# Patient Record
Sex: Female | Born: 1935 | Race: Black or African American | Hispanic: No | Smoking: Former smoker
Health system: Southern US, Community
[De-identification: ages and names within clinical notes are randomized; demographics above are authoritative.]

## PROBLEM LIST (undated history)

## (undated) DIAGNOSIS — I4821 Permanent atrial fibrillation: Secondary | ICD-10-CM

## (undated) DIAGNOSIS — K922 Gastrointestinal hemorrhage, unspecified: Secondary | ICD-10-CM

## (undated) DIAGNOSIS — R001 Bradycardia, unspecified: Secondary | ICD-10-CM

## (undated) DIAGNOSIS — R9430 Abnormal result of cardiovascular function study, unspecified: Secondary | ICD-10-CM

## (undated) DIAGNOSIS — K5790 Diverticulosis of intestine, part unspecified, without perforation or abscess without bleeding: Secondary | ICD-10-CM

## (undated) DIAGNOSIS — IMO0002 Reserved for concepts with insufficient information to code with codable children: Secondary | ICD-10-CM

## (undated) DIAGNOSIS — F0390 Unspecified dementia without behavioral disturbance: Secondary | ICD-10-CM

## (undated) DIAGNOSIS — T148XXA Other injury of unspecified body region, initial encounter: Secondary | ICD-10-CM

## (undated) DIAGNOSIS — I639 Cerebral infarction, unspecified: Secondary | ICD-10-CM

## (undated) DIAGNOSIS — K819 Cholecystitis, unspecified: Secondary | ICD-10-CM

## (undated) DIAGNOSIS — J90 Pleural effusion, not elsewhere classified: Secondary | ICD-10-CM

## (undated) DIAGNOSIS — T462X1A Poisoning by other antidysrhythmic drugs, accidental (unintentional), initial encounter: Secondary | ICD-10-CM

## (undated) DIAGNOSIS — Z9289 Personal history of other medical treatment: Secondary | ICD-10-CM

## (undated) DIAGNOSIS — D649 Anemia, unspecified: Secondary | ICD-10-CM

## (undated) DIAGNOSIS — I10 Essential (primary) hypertension: Secondary | ICD-10-CM

## (undated) DIAGNOSIS — I739 Peripheral vascular disease, unspecified: Secondary | ICD-10-CM

## (undated) HISTORY — DX: Bradycardia, unspecified: R00.1

## (undated) HISTORY — PX: OTHER SURGICAL HISTORY: SHX169

## (undated) HISTORY — DX: Abnormal result of cardiovascular function study, unspecified: R94.30

## (undated) HISTORY — DX: Peripheral vascular disease, unspecified: I73.9

## (undated) HISTORY — DX: Permanent atrial fibrillation: I48.21

## (undated) HISTORY — PX: PACEMAKER INSERTION: SHX728

## (undated) HISTORY — DX: Cholecystitis, unspecified: K81.9

## (undated) HISTORY — DX: Poisoning by other antidysrhythmic drugs, accidental (unintentional), initial encounter: T46.2X1A

## (undated) HISTORY — DX: Other injury of unspecified body region, initial encounter: T14.8XXA

## (undated) HISTORY — DX: Pleural effusion, not elsewhere classified: J90

## (undated) HISTORY — DX: Reserved for concepts with insufficient information to code with codable children: IMO0002

---

## 2010-07-08 ENCOUNTER — Encounter: Payer: Self-pay | Admitting: Internal Medicine

## 2010-09-02 ENCOUNTER — Inpatient Hospital Stay (HOSPITAL_COMMUNITY)
Admission: EM | Admit: 2010-09-02 | Discharge: 2010-09-07 | DRG: 378 | Disposition: A | Discharge status: Home / Self Care | Payer: Medicare Other | Source: Ambulatory Visit | Attending: Internal Medicine | Admitting: Internal Medicine

## 2010-09-02 ENCOUNTER — Encounter: Payer: Self-pay | Admitting: Internal Medicine

## 2010-09-02 DIAGNOSIS — I10 Essential (primary) hypertension: Secondary | ICD-10-CM | POA: Diagnosis present

## 2010-09-02 DIAGNOSIS — M4800 Spinal stenosis, site unspecified: Secondary | ICD-10-CM | POA: Diagnosis present

## 2010-09-02 DIAGNOSIS — D72829 Elevated white blood cell count, unspecified: Secondary | ICD-10-CM | POA: Diagnosis present

## 2010-09-02 DIAGNOSIS — Z8673 Personal history of transient ischemic attack (TIA), and cerebral infarction without residual deficits: Secondary | ICD-10-CM

## 2010-09-02 DIAGNOSIS — D649 Anemia, unspecified: Secondary | ICD-10-CM | POA: Diagnosis present

## 2010-09-02 DIAGNOSIS — F0390 Unspecified dementia without behavioral disturbance: Secondary | ICD-10-CM | POA: Diagnosis present

## 2010-09-02 DIAGNOSIS — D126 Benign neoplasm of colon, unspecified: Secondary | ICD-10-CM | POA: Diagnosis present

## 2010-09-02 DIAGNOSIS — Z23 Encounter for immunization: Secondary | ICD-10-CM

## 2010-09-02 DIAGNOSIS — K648 Other hemorrhoids: Secondary | ICD-10-CM | POA: Diagnosis present

## 2010-09-02 DIAGNOSIS — Z950 Presence of cardiac pacemaker: Secondary | ICD-10-CM

## 2010-09-02 DIAGNOSIS — K5731 Diverticulosis of large intestine without perforation or abscess with bleeding: Principal | ICD-10-CM | POA: Diagnosis present

## 2010-09-02 DIAGNOSIS — F05 Delirium due to known physiological condition: Secondary | ICD-10-CM | POA: Diagnosis not present

## 2010-09-02 DIAGNOSIS — Z79899 Other long term (current) drug therapy: Secondary | ICD-10-CM

## 2010-09-02 DIAGNOSIS — E876 Hypokalemia: Secondary | ICD-10-CM | POA: Diagnosis not present

## 2010-09-02 DIAGNOSIS — I4891 Unspecified atrial fibrillation: Secondary | ICD-10-CM | POA: Diagnosis present

## 2010-09-02 LAB — TYPE AND SCREEN
ABO/RH(D): O POS
Antibody Screen: NEGATIVE

## 2010-09-02 LAB — DIFFERENTIAL
Lymphs Abs: 1.5 10*3/uL (ref 0.7–4.0)
Monocytes Absolute: 0.8 10*3/uL (ref 0.1–1.0)
Monocytes Relative: 6 % (ref 3–12)
Neutro Abs: 10.1 10*3/uL — ABNORMAL HIGH (ref 1.7–7.7)
Neutrophils Relative %: 81 % — ABNORMAL HIGH (ref 43–77)

## 2010-09-02 LAB — CBC
HCT: 35.8 % — ABNORMAL LOW (ref 36.0–46.0)
HCT: 30.9 % — ABNORMAL LOW (ref 36.0–46.0)
Hemoglobin: 11.7 g/dL — ABNORMAL LOW (ref 12.0–15.0)
Hemoglobin: 10.1 g/dL — ABNORMAL LOW (ref 12.0–15.0)
MCH: 30.4 pg (ref 26.0–34.0)
MCH: 29.8 pg (ref 26.0–34.0)
MCHC: 32.7 g/dL (ref 30.0–36.0)
MCHC: 32.7 g/dL (ref 30.0–36.0)
MCV: 93 fL (ref 78.0–100.0)
MCV: 91.2 fL (ref 78.0–100.0)
Platelets: 115 10*3/uL — ABNORMAL LOW (ref 150–400)
Platelets: 108 10*3/uL — ABNORMAL LOW (ref 150–400)
RBC: 3.85 MIL/uL — ABNORMAL LOW (ref 3.87–5.11)
RBC: 3.39 MIL/uL — ABNORMAL LOW (ref 3.87–5.11)
RDW: 13.7 % (ref 11.5–15.5)
RDW: 13.8 % (ref 11.5–15.5)
WBC: 12.4 10*3/uL — ABNORMAL HIGH (ref 4.0–10.5)
WBC: 9.2 10*3/uL (ref 4.0–10.5)

## 2010-09-02 LAB — BASIC METABOLIC PANEL
BUN: 23 mg/dL (ref 6–23)
CO2: 27 mEq/L (ref 19–32)
Calcium: 9 mg/dL (ref 8.4–10.5)
Chloride: 104 mEq/L (ref 96–112)
Creatinine, Ser: 0.83 mg/dL (ref 0.50–1.10)
GFR calc Af Amer: >60 mL/min (ref >60)
GFR calc non Af Amer: >60 mL/min (ref >60)
Glucose, Bld: 123 mg/dL — ABNORMAL HIGH (ref 70–99)
Potassium: 4.2 mEq/L (ref 3.5–5.1)
Sodium: 139 mEq/L (ref 135–145)

## 2010-09-02 LAB — ABO/RH: ABO/RH(D): O POS

## 2010-09-02 LAB — OCCULT BLOOD, POC DEVICE: Fecal Occult Bld: POSITIVE

## 2010-09-02 NOTE — H&P (Signed)
Hospital Admission Note Date: 09/02/2010  Patient name: Brooke Griffin Medical record number: [id number] Date of birth: 1925-12-31 Age: 77 y.o. Gender: female PCP: No primary provider on file.  Medical Service: Internal Medicine  Attending physician:  Dr. Josem Kaufmann    Resident (R2/R3):  Dr. Baltazar Apo   Pager: [phone] Resident (R1):  Dr. Milbert Coulter   [phone]  Chief Complaint: Bright red blood per rectum  History of Present Illness:  Patient is an 77 yo female with a past medical history significant for diverticulosis, dementia, unknown cardiac condition requiring pacemaker who reports that she went to the bathroom this morning and noticed afterwards that her feces was very dark in color and that the toilet bowel was filled with bright red blood with clots. Patient's daughter then dressed patient with a panty-liner, which was quickly soiled with blood. Daughter claims the feces seemed to be mixed with blood, but admits she did not verify the blood was coming from the rectum and not the vagina. Patient claims that this is the first episode of this happening that she can recall, however, patient's daughter endorses of a history of diverticulosis requiring hospitalization approximately 20 years ago (which has not recurred since). Because of this bleeding, patient has not taken any medications today and has not eaten.  Patient admits to having dark colored stools for the past few days. Additionally, patient denies nausea but reports one episode of clear, watery emesis this morning.  Patient also states she has had intermittent lower abdominal cramping which shoots around to her back bilaterally for 2 weeks. She saw her PCP about this and he recommended Aleve BID, as he suspected this was caused by her spinal stenosis. Patient was give Aleve Rx 2 weeks ago, took Aleve BID for 1 week, last dose 08/29/2010. Patient denies increased fatigue or weakness, but does admit that she has been feeling slight more  lightheaded for a few days. This lightheadedness is not caused or exacerbated by standing. Patient also claims she has had bilateral shoulder pain for 1 week, and besides the abdominal cramping, denies any other pain. Patient denies having pain today. Patient has not signs of worsening or exacerbated dementia recently/changes to mental status, per daughter. Patient denies a history of hemorrhoids, constipation, diverticulitis, or any GI disease/problems. Denies recent diarrhea, change in consistency or frequency of stool, or pain with defecation. Also denies dysuria, hematuria, or increased frequency of urination.  Additionally, patient had "benign" polyps removed at last colonoscopy, which was "well over ten years ago," according to patient's daughter.    Allergies: NKDA  Medications: 1- Aleve (Naproxen Sodium) 220 mg 2 tablets once daily. (prescribed 2 wks ago, patient took this for 1 week, last dose 08/29/2010).  2- Atenolol 50 mg PO Qday 3- Lisinopril 40 mg PO Qday 4- Aggrenox (Aspirin/Dipyridamole XR) 25/200 PO 1 tablet daily 5- Digoxin 0.25 mg PO 1 tablet daily   PCP: [facility] in [place], [place].   Past Medical History: 1- Diverticulosis, requiring hospitalization approximately 20 yrs ago, asymptomatic since.  2- Pacemaker for an unknown cardiac condition which daughter believes is due to "her heart beating too fast," placed over ten years ago and is "due to be changed out," per daughter. Patient reports she is supposed to make an appointment with her cardiologist in [facility], [facility] Shark # [phone]. Patient on Digoxin and Aggrenox.  3- Spinal Stenosis. Duration unknown by daughter or patient.  4- Stroke, with residual right sided weakness. 2004.  5-TIA's, numerous.  6-  Hypertension, on home Atenolol and Lisinopril 7-Fibroids (now s/p hysterectomy) 8- Polyps, reportedly benign, removed >10 yrs ago 9- Dementia, type unknown, though daughter reports no MRI  changes when diagnosed a few yrs ago and believes the dementia is a result of her stroke.   Past Surgical History: 1- Hysterectomy when patient in her 64's for fibroids 2- Left Breast Cystectomy > 20 yrs ago, reportedly benign  Past Hospitalizations:  1-Stroke, 2004.   2-Broken Ankle, 40 yrs ago  3-Diverticulosis, 20 yrs ago.  Social History: Patient is from [place] town about [place], [place] and moved down to [place] 1 month ago to live with her daughter. Her daughter felt that since her mother has dementia, that she could care for her better than in her mother's previous state of independent living. Patient's daughter is her only child, and lives in [place]. Patient able to bathe, dress, and maintain appropriate hygiene by herself, though does not prepare her own finances or meals. Patient widowed in 2001. Patient drinks socially, liquor-3-4 drinks/week. Patient smokes 1.5 packs every 2 days, for over 56 years (>42 pack-years smoking history). Denies any recent or past recreational drug use or abuse. No history of ETOH abuse.   Family History: Negative family history of Colorectal cancer, Diverticulosis, Diverticulitis, DM, or HLD.  Mother: HTN Father: died of Leukemia, type unknown.   Review of Systems: Pertinent items are noted in HPI. Gen: Denies unintended changes to weight. Denies weakness, fever, chills, night sweats or fatigue.  Skin: Denies new lesions or rashes.  Head: Denies history of falls, no head trauma. Denies nausea or visual changes. +Emesis this am. Eyes: Denies blurry vision or acute visual loss.  Nose: +chronic allergic rhinitis. Denies nasal congestion.  Mouth: Denies bleeding gums or hoarseness.  Cardiac: Denies palpitations, PND, or angina. Respiratory: Denies wheezing, cough, sputum, hemoptysis.  GI: Denies changes to appetite, nausea, GERD or indigestion. Reports long-standing history of mild dysphagia. Denies abdominal pain. Further GI ROS as per HPI.   Urinary: Denies increased frequency, dysuria, hematuria, history of UTI's, or recent changes to mental status.  MSK: Denies muscle weakness other than residual right sided weakness from stroke.  Neuro: Denies loss of sensation/numbness, tingling, weakness, syncope, or seizures.  Psych: Denies history of anxiety or depression.   Physical Exam:  Vitals: Temp: 97.6, RR 20, HR 94, BP 113/64, O2 sat 98% on ra Repeat BP with orthostatics:  1- supine 135/63 with HR 84 2- sitting 127/71 with HR 75 3- standing 134/69 with HR 73  Gen: 77 yo [place] American female in no acute distress who appears her stated age, well-developed, well-dressed and in good hygiene who is sitting comfortably in bed. Pleasant but slightly confused affect. Skin: No visible lesions or rashes. Normal skin turgor. Head: NCAT.  Eyes: PERRLA. EOMI. Sclerae anicteric. Pink conjunctiva.  Ears: Bilaterally symmetric. No otorrhea or discharge. No gross inflammation. Normal gross auditory acuity.  Nose: No obvious inflammation, no rhinorrhea on exam. Throat: Good dental hygiene. Mildly dry mucous membranes No inflammation to oropharynx. Heart: Irregularly irregular rate, normal rhythm. No audible m/r/g. +2 radial pulses bilaterally, +1 posterior tibial pulses bilaterally.  Lungs: Chest symmetric with respirations. No increased work of breathing. CTAB. No wheezes, rales or rhonchi.  Abdomen: NABS. No palpable masses. No rebound or guarding. Midline abdominal scar from prior hysterectomy.  Rectal: Multiple skin tags around anus. No signs of internal or external hemorrhoids. Normal sphincter tone. No blood per rectal exam, though ED physician  reports +blood on rectal exam when patient first presented.  Neuro: CN 2-12 grossly intact.    Lab results:  CBC:    Component Value Date/Time   WBC 12.4* 09/02/2010 1230   HGB 11.7* 09/02/2010 1230   HCT 35.8* 09/02/2010 1230   PLT 115* 09/02/2010 1230   MCV 93.0 09/02/2010 1230    NEUTROABS 10.1* 09/02/2010 1230   LYMPHSABS 1.5 09/02/2010 1230   MONOABS 0.8 09/02/2010 1230   EOSABS 0.1 09/02/2010 1230   BASOSABS 0.0 09/02/2010 1230   % PMN's 81 (H-uln is 77), % Lymphs 12, % Monos 6, 0 Eosinophils, 0 Basophils, Abs lymphs 1.5, Abs monos 0.8, Abs Eos 0.1, Abs Basos 0, Abs PMN's 10.1 (H-uln is 7.7).  Comprehensive Metabolic Panel:    Component Value Date/Time   NA 139 09/02/2010 1230   K 4.2 09/02/2010 1230   CL 104 09/02/2010 1230   CO2 27 09/02/2010 1230   BUN 23 09/02/2010 1230   CREATININE 0.83 09/02/2010 1230   GLUCOSE 123* 09/02/2010 1230   CALCIUM 9.0 09/02/2010 1230    Assessment & Plan by Problem:  Assessment and Plan: Ms. Kay Shippy is an 77 yo F with a past medical history significant for diverticulosis, pacemaker for unknown cardiac history, and spinal stenosis who presents to the ED today due to bright red blood in her dark appearing stool this morning. Patient admitted to telemetry for likely overnight observation and monitoring.   1-GI Bleed: Patient reports history of diverticulosis, denies current pain, has no peritoneal signs on exam, was not actively bleeding on physical exam, and does not appear significantly dehydrated on exam. Vital signs not concerning for orthostatic hypotension. GI bleed likely lower in origin, considering bright red blood in stool with clots. Upper GI bleed possible as patient reports a few days of melena, though less likely given report of recent grossly bloody stool. Diverticulosis most likely cause of patient's painless rectal bleeding, given consistent recent and past medical history. Other potential causes of patient's likely lower GI bleed include cancer (patient reportedly long-overdue for colonoscopy), angiodysplasia (given patient's age). Ischemic bleed unlikely given no history of acute abdominal pain associated with onset of bleed.   Plan: -admit patient for overnight observation given significant cardiac history for  undetermined condition (patient on 2nd pacemaker with apparent need for pacemaker re-evaluation by cardiology), and history of stroke (undetermined by patient if ischemic or embolic in origin). Although gross bleed ceased by time of exam by admitting team, monitoring patient overnight  -monitor frequent vital signs -rehydrate with 0.9% NS at 100 cc/hr x12 hr given history of GI bleed, decreased PO intake today, emesis, and clinical signs of dehydration such as mildly dry mucous membranes  -am BMET and CBC. CBC at 9 pm tonight as well.  -Zofran 4 mg IV q4hr prn nausea -Tylenol 650 mg po q6hr prn pain/fever -will hold Aggrenox for tonight and resume in AM  2- Unknown Cardiac History requiring pacemaker: Patient reports requiring pacemaker for uncertain cause and says she is due to follow up with her cardiologist in [facility]. Patient with irregularly irregular HR on exam. -pending 12-lead ECG -will obtain more detailed information from pt's cardiologist Owens Shark [phone]) -observe on telemetry -restarted home Digoxin 0.25 mg po Qday, restarted Atenolol at half her daily dose giving 25 mg po Qday.  -holding home Lisinopril 40 mg qday and home Aggrenox 25/200 mg PO qday overnight, will consider restarting these meds tomorrow.  3-HTN: Long-standing history of HTN, likely cause  of patient's TIA's and past stroke. Pt with BP only mildly hypertensive (135/63 supine), thus will hold Lisinopril and give half dose of Atenolol until rehydrated overnight.  -BP meds as stated above.   4-Mild Leukocytosis with Left shift: WBC ct 12.4 with increased abs PMN's. Possibly stress reaction from GI bleed. We will have a low threshold to obtain U/A if patient begins to have any urinary symptoms or declining or fluctuating mental status.  -will follow wbc ct c evening and am CBC.   5-Dementia: Patient has long-standing history of dementia, thought to be related to stroke.  -Lights on during day. Will  frequently reorient patient. Encouraging frequent family visits.   6.-VTE Prophylaxis: SCDs

## 2010-09-03 DIAGNOSIS — K625 Hemorrhage of anus and rectum: Secondary | ICD-10-CM

## 2010-09-03 DIAGNOSIS — D62 Acute posthemorrhagic anemia: Secondary | ICD-10-CM

## 2010-09-03 DIAGNOSIS — K922 Gastrointestinal hemorrhage, unspecified: Secondary | ICD-10-CM

## 2010-09-03 LAB — BASIC METABOLIC PANEL
CO2: 24 mEq/L (ref 19–32)
Calcium: 8.4 mg/dL (ref 8.4–10.5)
Creatinine, Ser: 0.83 mg/dL (ref 0.50–1.10)
GFR calc non Af Amer: >60 mL/min (ref >60)
Glucose, Bld: 95 mg/dL (ref 70–99)
Sodium: 141 mEq/L (ref 135–145)

## 2010-09-03 LAB — CBC
HCT: 30.8 % — ABNORMAL LOW (ref 36.0–46.0)
MCH: 29.9 pg (ref 26.0–34.0)
MCH: 30.3 pg (ref 26.0–34.0)
MCHC: 32.6 g/dL (ref 30.0–36.0)
MCHC: 32.5 g/dL (ref 30.0–36.0)
MCHC: 32.4 g/dL (ref 30.0–36.0)
MCV: 91.8 fL (ref 78.0–100.0)
MCV: 92.8 fL (ref 78.0–100.0)
MCV: 93.6 fL (ref 78.0–100.0)
Platelets: 112 10*3/uL — ABNORMAL LOW (ref 150–400)
Platelets: 113 10*3/uL — ABNORMAL LOW (ref 150–400)
Platelets: 109 10*3/uL — ABNORMAL LOW (ref 150–400)
RBC: 3.31 MIL/uL — ABNORMAL LOW (ref 3.87–5.11)
RDW: 13.8 % (ref 11.5–15.5)
RDW: 13.9 % (ref 11.5–15.5)
WBC: 7.1 10*3/uL (ref 4.0–10.5)

## 2010-09-03 LAB — DIGOXIN LEVEL: Digoxin Level: 1.2 ng/mL (ref 0.8–2.0)

## 2010-09-04 DIAGNOSIS — K922 Gastrointestinal hemorrhage, unspecified: Secondary | ICD-10-CM

## 2010-09-04 DIAGNOSIS — R29898 Other symptoms and signs involving the musculoskeletal system: Secondary | ICD-10-CM

## 2010-09-04 LAB — CBC
Hemoglobin: 8.4 g/dL — ABNORMAL LOW (ref 12.0–15.0)
MCH: 30.8 pg (ref 26.0–34.0)
MCHC: 33.2 g/dL (ref 30.0–36.0)
Platelets: 112 10*3/uL — ABNORMAL LOW (ref 150–400)
Platelets: 125 10*3/uL — ABNORMAL LOW (ref 150–400)
RBC: 2.73 MIL/uL — ABNORMAL LOW (ref 3.87–5.11)
RDW: 13.9 % (ref 11.5–15.5)
WBC: 7.9 10*3/uL (ref 4.0–10.5)
WBC: 8 10*3/uL (ref 4.0–10.5)

## 2010-09-05 ENCOUNTER — Other Ambulatory Visit: Payer: Self-pay | Admitting: Internal Medicine

## 2010-09-05 DIAGNOSIS — K5730 Diverticulosis of large intestine without perforation or abscess without bleeding: Secondary | ICD-10-CM

## 2010-09-05 DIAGNOSIS — D126 Benign neoplasm of colon, unspecified: Secondary | ICD-10-CM

## 2010-09-05 DIAGNOSIS — K921 Melena: Secondary | ICD-10-CM

## 2010-09-05 LAB — BASIC METABOLIC PANEL
BUN: 17 mg/dL (ref 6–23)
CO2: 26 mEq/L (ref 19–32)
Chloride: 110 mEq/L (ref 96–112)
GFR calc Af Amer: >60 mL/min (ref >60)
Potassium: 3.2 mEq/L — ABNORMAL LOW (ref 3.5–5.1)

## 2010-09-05 LAB — CBC
HCT: 24.9 % — ABNORMAL LOW (ref 36.0–46.0)
HCT: 25.2 % — ABNORMAL LOW (ref 36.0–46.0)
Hemoglobin: 8.2 g/dL — ABNORMAL LOW (ref 12.0–15.0)
Hemoglobin: 8.4 g/dL — ABNORMAL LOW (ref 12.0–15.0)
MCHC: 33.3 g/dL (ref 30.0–36.0)
MCV: 92 fL (ref 78.0–100.0)
RBC: 2.72 MIL/uL — ABNORMAL LOW (ref 3.87–5.11)
RDW: 13.9 % (ref 11.5–15.5)
WBC: 8.9 10*3/uL (ref 4.0–10.5)

## 2010-09-06 ENCOUNTER — Inpatient Hospital Stay (HOSPITAL_COMMUNITY): Payer: Medicare Other

## 2010-09-06 DIAGNOSIS — K5731 Diverticulosis of large intestine without perforation or abscess with bleeding: Secondary | ICD-10-CM

## 2010-09-06 DIAGNOSIS — D126 Benign neoplasm of colon, unspecified: Secondary | ICD-10-CM

## 2010-09-06 LAB — CBC
HCT: 27.3 % — ABNORMAL LOW (ref 36.0–46.0)
Hemoglobin: 7.4 g/dL — ABNORMAL LOW (ref 12.0–15.0)
MCH: 30.5 pg (ref 26.0–34.0)
MCH: 30.5 pg (ref 26.0–34.0)
MCHC: 32.7 g/dL (ref 30.0–36.0)
MCHC: 34.1 g/dL (ref 30.0–36.0)
MCV: 93 fL (ref 78.0–100.0)
MCV: 89.5 fL (ref 78.0–100.0)
Platelets: 129 10*3/uL — ABNORMAL LOW (ref 150–400)
RBC: 2.43 MIL/uL — ABNORMAL LOW (ref 3.87–5.11)
RDW: 15.8 % — ABNORMAL HIGH (ref 11.5–15.5)

## 2010-09-06 LAB — BASIC METABOLIC PANEL
CO2: 25 mEq/L (ref 19–32)
Calcium: 8.1 mg/dL — ABNORMAL LOW (ref 8.4–10.5)
Creatinine, Ser: 0.8 mg/dL (ref 0.50–1.10)
Glucose, Bld: 121 mg/dL — ABNORMAL HIGH (ref 70–99)

## 2010-09-07 LAB — CROSSMATCH

## 2010-09-07 LAB — BASIC METABOLIC PANEL
Calcium: 8.3 mg/dL — ABNORMAL LOW (ref 8.4–10.5)
Creatinine, Ser: 0.85 mg/dL (ref 0.50–1.10)
GFR calc non Af Amer: >60 mL/min (ref >60)
Sodium: 143 mEq/L (ref 135–145)

## 2010-09-07 LAB — CBC
MCH: 30 pg (ref 26.0–34.0)
MCHC: 33.1 g/dL (ref 30.0–36.0)
Platelets: 130 10*3/uL — ABNORMAL LOW (ref 150–400)
RDW: 15.8 % — ABNORMAL HIGH (ref 11.5–15.5)

## 2010-09-08 ENCOUNTER — Telehealth: Payer: Self-pay

## 2010-09-08 NOTE — Telephone Encounter (Signed)
Message copied by Michele Mcalpine on Mon Sep 08, 2010  4:46 PM ------      Message from: Hilarie Fredrickson      Created: Mon Sep 08, 2010  4:32 PM       Please call patient and her daughter. But then noted that the polyp specimens were precancerous, but no cancer. She needs to have a recall colonoscopy in 3 MONTHS, in the [facility]. Please make recall

## 2010-09-08 NOTE — Telephone Encounter (Signed)
Pt and her daughter aware, recall entered.

## 2010-09-27 NOTE — Discharge Summary (Signed)
Brooke Griffin, Brooke Griffin              ACCOUNT NO.:  [id number]  MEDICAL RECORD NO.:  [id number]  LOCATION:  5020                         FACILITY:  [facility]  PHYSICIAN:  Doneen Poisson, MD     DATE OF BIRTH:  1925-05-02  DATE OF ADMISSION:  09/02/2010 DATE OF DISCHARGE:  09/07/2010                              DISCHARGE SUMMARY   DISCHARGE DIAGNOSES: 1. Acute GI bleed secondary to diverticulosis. 2. Multiple colonic polyps cannot rule out malignancy. 3. Internal hemorrhoids. 4. Hospital-acquired delirium. 5. Chronic atrial fibrillation with rapid ventricular response treated     with pacemaker. 6. Dementia. 7. Stroke. 8. Hypertension. 9. Spinal stenosis.  DISCHARGE MEDICATIONS: 1. Protonix 40 mg daily. 2. Digoxin 0.25 mg daily.  The following medications are to be stopped until she receives follow up  with her PCP: 1. Aggrenox. 2. Atenolol. 3. Lisinopril.  DISPOSITION AND FOLLOWUP:  [name] is to see her PCP in 1-2 weeks. The daughter agreed to call the PCP to make this appointment on the  morning of Monday, September 08, 2010.  The hemoglobin should be rechecked to verify that it is stable and it should be determined if she should be continued on Aggrenox for stroke and atrial fibrillation prophylaxis given her findings of severe diverticulosis.  PROCEDURES PERFORMED:  September 02, 2010, EKG, AFib/flutter and ventricular paced complexes with low  voltage throughout  Abdominal x-ray one view September 06, 2010.  There is a nonobstructive bowel  gas pattern and an approximately 1-cm clip projects over the distal  transverse colon in the left upper quadrant.  Colonoscopy remarkable for significant diverticulosis to left descending  colon.  Eight polyps with one to rule out malignancy and internal  hemorrhoids.  [employer] recalibrated pacemaker secondary to pacemaker failure on  September 04, 2010.  CONSULTATIONS:  Gastroenterology who performed above-stated colonoscopy [employer]  who recalibrated the patient's pacemaker.  BRIEF ADMITTING HISTORY AND PHYSICAL:  [name] is a 77 year old woman with a past medical history significant for diverticulosis, dementia, and chronic atrial fibrillation requiring pacemaker who reports that she went to the bathroom this morning and noticed afterwards that her feces was very dark in color and that the toilet bowl was filled with bright red blood with clots.  Her daughter then dressed her with  a panty liner which was then quickly soiled with blood.  The daughter  claims the feces seemed to be mixed with blood, but admits that she did  not verify if the blood was coming from the rectum and not the vagina.  The daughter called her mother's PCP who said that if she had any more bloody stools to go to the ED.  She subsequently had  another bloody stool.  She claims that this is the first episode of  this happening that she can recall.  However, she has baseline dementia  and her daughter endorses a history of diverticulosis requiring  hospitalization approximately 20 years ago, though has not recurred since.   Because of this bleeding, she has not taken any medications and has not eaten.  She admits to having dark colored stools for the past few days. Additionally, she denies nausea, but reports one episode of  clear watery emesis this morning.  She also states that she had intermittent lower abdominal cramping which shoots around to her back bilaterally for 2 weeks.  She saw her PCP about this and he recommended Aleve b.i.d. as he suspected spinal stenosis was causing her pain.  She was given Aleve for 2 weeks, but only took Aleve twice a day for 1 week.   Her last dose was August 29, 2010.  She denies increased fatigue or  weakness, but does admit that she began feeling slightly more lightheaded  for a few days.  This lightheadedness was not caused or exacerbated by  standing.  She also claims that she had bilateral shoulder pain  for 1  week and besides the abdominal cramping denies any other pain.  She  denies having pain today.  She has no signs of worsening or exacerbated  dementia recently or recent changes to mental status per daughter.  She  denies a history of hemorrhoids, constipation, diverticulitis or any GI  diseases or problems other than diverticulosis.  No recent diarrhea,  change in consistency or frequency of stool or pain with defecation.   She also denies dysuria, hematuria and increased frequency of urination. Additionally, she reports benign polyps removed at last colonoscopy  which was well over 10 years ago according to her daughter.  PHYSICAL EXAMINATION:  VITAL SIGNS:  Temperature 97.6, heart rate 94, blood pressure 113/64,  respiratory rate 20, oxygen saturation 98% on room air.  Orthostatics: Supine blood pressure 135/63, heart rate 84 Standing blood pressure 134/69, heart rate 73  GENERAL:  77 year old African American woman, in no acute distress who appears stated age, well-developed, who is sitting comfortably  in bed, pleasant, but slightly confused affect. SKIN:  No visible lesions or rashes.  Normal skin turgor. EYES:  PERRLA, EOMI, sclerae anicteric.  Pink conjunctivae. EARS:  Bilaterally symmetric.  No otorrhea or discharge.  No gross inflammation. NOSE:  No rhinorrhea on exam. THROAT:  Good dental hygiene, mildly dry mucous membranes.  No inflammation to oropharynx. HEART:  Irregularly irregular rate, normal rhythm.  No audible murmurs, rubs or gallops.  Radial pulses +2 bilaterally, +1 tibial pulses bilaterally. LUNGS:  Chest symmetric with respirations, no increased work of breathing.  CTAB.  No wheezes, rales or rhonchi. ABDOMEN:  NABS.  No palpable masses.  No rebound or guarding.  Midline abdominal scar from prior hysterectomy. RECTAL:  Multiple skin tags around anus.  No signs of internal or external hemorrhoids.  Normal sphincter tone.  No blood per rectal  exam, though ED physician reports positive blood in rectal exam when she first presented to ED. NEURO:  Cranial nerves II through XII grossly intact.  LABORATORY DATA ON ADMISSION:  White blood cell count 12.4, H and H 11.7 and 35.8, platelets 115.  Sodium 139, potassium 4.2, chloride 104,  CO2 27, BUN 23, creatinine 0.83, glucose 123, calcium 9.0.  HOSPITAL COURSE:  [name] is an 77 year old woman with a past medical history significant for diverticulosis, pacemaker for chronic AFib with RVR and spinal stenosis who was admitted to [facility] for a GI bleed.  1. GI bleed.  [name] reports a history of diverticulosis, denied     any associated abdominal pain and had no peritoneal signs on exam     and did not have positive orthostatics or appear severely dehydrated.       The etiology of the GI bleed was likely lower in origin given  history of bright red rectal bleed with clots, and external hemorrhoids     GI was consulted given that it had been over 10 years since her      previous colonoscopy and she endorses removal of a "benign" polyp.       GI performed a colonoscopy and found her to have severe diverticulosis,      8 polyps which were sent to pathology to rule out malignancy.  She was      transfused 2 units of packed red blood cells after colonoscopy.  2. Pacemaker failure.  [name] has a pacemaker for treatment of her     chronic atrial fibrillation with RVR and was last seen by her     cardiologist on Jul 13, 2010, for assessment of proper pacemaker function.      She was found to have multiple episodes in which her pacemaker failed to      capture beats. Her cardiologist was notified and the [employer]      technologist came to interrogate the pacemaker.    3. Hypertension. [name] Lisinopril and Atenolol were held in the    setting of an acute GI Bleed and was instructed to restart these medications    when seen by her PCP.    4. Hospital-acquired  delirium.  Mr. Brooke Griffin has a longstanding history     of baseline dementia, she demonstrated signs of delirium at night, such as      increased confusion, fluctuating mental status, pulling outlines and      altered sleep cycle.  Her daughter was notified and undertook measures      to assist with reorientation such as having family spend as much time as      possible with her, frequent reorienting and providing her with her glasses as     needed.  These measures seemed to improved these symptoms by the     time of discharge.  DISCHARGE LABORATORY DATA AND VITALS:  Temperature 97.8, heart rate 85, respiratory rate 16, blood pressure 115/52 and oxygen saturation 98% on room air.  Sodium 143, potassium 6.3, chloride 110, CO2 29, BUN 13, creatinine 0.85, eGFR greater than 60, calcium 8.3, white blood cell count 11.6, H and H 9.3 and 28.1, platelets 130.   ______________________________ Melida Quitter, MD   ______________________________ Doneen Poisson, MD   AS/MEDQ  D:  09/08/2010  T:  09/09/2010  Job:  [id number]  cc:   Dr. Ladona Ridgel  Electronically Signed by Melida Quitter  on 09/24/2010 04:28:29 PM Electronically Signed by Doneen Poisson  on 09/27/2010 12:16:51 PM

## 2010-11-15 ENCOUNTER — Emergency Department (HOSPITAL_COMMUNITY): Payer: Medicare Other

## 2010-11-15 ENCOUNTER — Inpatient Hospital Stay (HOSPITAL_COMMUNITY)
Admission: EM | Admit: 2010-11-15 | Discharge: 2010-12-04 | DRG: 418 | Disposition: A | Discharge status: Home Health | Payer: Medicare Other | Source: Ambulatory Visit | Attending: Internal Medicine | Admitting: Internal Medicine

## 2010-11-15 DIAGNOSIS — J90 Pleural effusion, not elsewhere classified: Secondary | ICD-10-CM | POA: Diagnosis not present

## 2010-11-15 DIAGNOSIS — E872 Acidosis, unspecified: Secondary | ICD-10-CM | POA: Diagnosis present

## 2010-11-15 DIAGNOSIS — Z8673 Personal history of transient ischemic attack (TIA), and cerebral infarction without residual deficits: Secondary | ICD-10-CM

## 2010-11-15 DIAGNOSIS — R109 Unspecified abdominal pain: Secondary | ICD-10-CM

## 2010-11-15 DIAGNOSIS — K8010 Calculus of gallbladder with chronic cholecystitis without obstruction: Secondary | ICD-10-CM

## 2010-11-15 DIAGNOSIS — I10 Essential (primary) hypertension: Secondary | ICD-10-CM | POA: Diagnosis present

## 2010-11-15 DIAGNOSIS — L03319 Cellulitis of trunk, unspecified: Secondary | ICD-10-CM | POA: Diagnosis not present

## 2010-11-15 DIAGNOSIS — I959 Hypotension, unspecified: Secondary | ICD-10-CM | POA: Diagnosis present

## 2010-11-15 DIAGNOSIS — D72829 Elevated white blood cell count, unspecified: Secondary | ICD-10-CM | POA: Diagnosis present

## 2010-11-15 DIAGNOSIS — F17200 Nicotine dependence, unspecified, uncomplicated: Secondary | ICD-10-CM | POA: Diagnosis present

## 2010-11-15 DIAGNOSIS — R911 Solitary pulmonary nodule: Secondary | ICD-10-CM | POA: Diagnosis present

## 2010-11-15 DIAGNOSIS — D62 Acute posthemorrhagic anemia: Secondary | ICD-10-CM | POA: Diagnosis not present

## 2010-11-15 DIAGNOSIS — L02219 Cutaneous abscess of trunk, unspecified: Secondary | ICD-10-CM | POA: Diagnosis not present

## 2010-11-15 DIAGNOSIS — E876 Hypokalemia: Secondary | ICD-10-CM | POA: Diagnosis not present

## 2010-11-15 DIAGNOSIS — Z950 Presence of cardiac pacemaker: Secondary | ICD-10-CM

## 2010-11-15 DIAGNOSIS — K810 Acute cholecystitis: Principal | ICD-10-CM | POA: Diagnosis present

## 2010-11-15 DIAGNOSIS — N390 Urinary tract infection, site not specified: Secondary | ICD-10-CM | POA: Diagnosis present

## 2010-11-15 DIAGNOSIS — I4891 Unspecified atrial fibrillation: Secondary | ICD-10-CM | POA: Diagnosis present

## 2010-11-15 HISTORY — DX: Essential (primary) hypertension: I10

## 2010-11-15 HISTORY — DX: Diverticulosis of intestine, part unspecified, without perforation or abscess without bleeding: K57.90

## 2010-11-15 HISTORY — DX: Unspecified dementia, unspecified severity, without behavioral disturbance, psychotic disturbance, mood disturbance, and anxiety: F03.90

## 2010-11-15 HISTORY — DX: Gastrointestinal hemorrhage, unspecified: K92.2

## 2010-11-15 HISTORY — DX: Cerebral infarction, unspecified: I63.9

## 2010-11-15 LAB — URINALYSIS, ROUTINE W REFLEX MICROSCOPIC
Glucose, UA: NEGATIVE mg/dL
Ketones, ur: 15 mg/dL — AB
Nitrite: POSITIVE — AB
Protein, ur: NEGATIVE mg/dL
Urobilinogen, UA: 2 mg/dL — ABNORMAL HIGH (ref 0.0–1.0)

## 2010-11-15 LAB — COMPREHENSIVE METABOLIC PANEL
Albumin: 3.3 g/dL — ABNORMAL LOW (ref 3.5–5.2)
Alkaline Phosphatase: 55 U/L (ref 39–117)
BUN: 26 mg/dL — ABNORMAL HIGH (ref 6–23)
Calcium: 9.3 mg/dL (ref 8.4–10.5)
GFR calc Af Amer: >60 mL/min (ref >60)
Glucose, Bld: 116 mg/dL — ABNORMAL HIGH (ref 70–99)
Potassium: 3.7 mEq/L (ref 3.5–5.1)
Sodium: 137 mEq/L (ref 135–145)
Total Protein: 7.1 g/dL (ref 6.0–8.3)

## 2010-11-15 LAB — CBC
Hemoglobin: 14.1 g/dL (ref 12.0–15.0)
MCHC: 32.9 g/dL (ref 30.0–36.0)
Platelets: 167 10*3/uL (ref 150–400)
RBC: 4.76 MIL/uL (ref 3.87–5.11)

## 2010-11-15 LAB — URINE MICROSCOPIC-ADD ON

## 2010-11-15 LAB — DIFFERENTIAL
Basophils Absolute: 0 10*3/uL (ref 0.0–0.1)
Basophils Relative: 0 % (ref 0–1)
Eosinophils Absolute: 0 10*3/uL (ref 0.0–0.7)
Neutro Abs: 15.2 10*3/uL — ABNORMAL HIGH (ref 1.7–7.7)
Neutrophils Relative %: 83 % — ABNORMAL HIGH (ref 43–77)

## 2010-11-15 LAB — GLUCOSE, CAPILLARY: Glucose-Capillary: 111 mg/dL — ABNORMAL HIGH (ref 70–99)

## 2010-11-15 LAB — POCT I-STAT TROPONIN I: Troponin i, poc: 0 ng/mL (ref 0.00–0.08)

## 2010-11-15 LAB — DIGOXIN LEVEL: Digoxin Level: 1.5 ng/mL (ref 0.8–2.0)

## 2010-11-16 LAB — DIFFERENTIAL
Basophils Relative: 0 % (ref 0–1)
Eosinophils Absolute: 0 10*3/uL (ref 0.0–0.7)
Eosinophils Relative: 0 % (ref 0–5)
Lymphs Abs: 1.4 10*3/uL (ref 0.7–4.0)
Monocytes Absolute: 1.9 10*3/uL — ABNORMAL HIGH (ref 0.1–1.0)
Monocytes Relative: 11 % (ref 3–12)
Neutrophils Relative %: 81 % — ABNORMAL HIGH (ref 43–77)

## 2010-11-16 LAB — COMPREHENSIVE METABOLIC PANEL
AST: 41 U/L — ABNORMAL HIGH (ref 0–37)
CO2: 24 mEq/L (ref 19–32)
Calcium: 8.6 mg/dL (ref 8.4–10.5)
Creatinine, Ser: 0.83 mg/dL (ref 0.50–1.10)
GFR calc Af Amer: >60 mL/min (ref >60)
GFR calc non Af Amer: >60 mL/min (ref >60)
Sodium: 141 mEq/L (ref 135–145)
Total Protein: 6.3 g/dL (ref 6.0–8.3)

## 2010-11-16 LAB — CBC
MCH: 28.9 pg (ref 26.0–34.0)
MCHC: 32.1 g/dL (ref 30.0–36.0)
MCV: 90.2 fL (ref 78.0–100.0)
Platelets: 168 10*3/uL (ref 150–400)
RBC: 4.39 MIL/uL (ref 3.87–5.11)

## 2010-11-17 ENCOUNTER — Inpatient Hospital Stay (HOSPITAL_COMMUNITY): Payer: Medicare Other

## 2010-11-17 LAB — CBC
HCT: 37 % (ref 36.0–46.0)
Hemoglobin: 11.9 g/dL — ABNORMAL LOW (ref 12.0–15.0)
MCH: 28.9 pg (ref 26.0–34.0)
RBC: 4.12 MIL/uL (ref 3.87–5.11)

## 2010-11-17 LAB — URINE CULTURE
Colony Count: NO GROWTH
Culture: NO GROWTH

## 2010-11-17 LAB — SURGICAL PCR SCREEN: Staphylococcus aureus: NEGATIVE

## 2010-11-17 MED ORDER — TECHNETIUM TC 99M TETROFOSMIN IV KIT
30.0000 | PACK | Freq: Once | INTRAVENOUS | Status: AC | PRN
  Administered 2010-11-17: 30 via INTRAVENOUS

## 2010-11-17 MED ORDER — TECHNETIUM TC 99M TETROFOSMIN IV KIT
10.0000 | PACK | Freq: Once | INTRAVENOUS | Status: AC | PRN
  Administered 2010-11-17: 10 via INTRAVENOUS

## 2010-11-18 LAB — CBC
MCV: 88.7 fL (ref 78.0–100.0)
Platelets: 198 10*3/uL (ref 150–400)
RBC: 3.98 MIL/uL (ref 3.87–5.11)
WBC: 14.6 10*3/uL — ABNORMAL HIGH (ref 4.0–10.5)

## 2010-11-18 LAB — BASIC METABOLIC PANEL
CO2: 28 mEq/L (ref 19–32)
Chloride: 103 mEq/L (ref 96–112)
Creatinine, Ser: 0.81 mg/dL (ref 0.50–1.10)
GFR calc Af Amer: >60 mL/min (ref >60)
Potassium: 2.9 mEq/L — ABNORMAL LOW (ref 3.5–5.1)
Sodium: 139 mEq/L (ref 135–145)

## 2010-11-19 ENCOUNTER — Inpatient Hospital Stay (HOSPITAL_COMMUNITY): Payer: Medicare Other

## 2010-11-19 ENCOUNTER — Encounter (HOSPITAL_COMMUNITY): Payer: Self-pay | Admitting: Radiology

## 2010-11-19 LAB — CBC
HCT: 35.2 % — ABNORMAL LOW (ref 36.0–46.0)
Hemoglobin: 11.7 g/dL — ABNORMAL LOW (ref 12.0–15.0)
RBC: 3.99 MIL/uL (ref 3.87–5.11)
WBC: 9.4 10*3/uL (ref 4.0–10.5)

## 2010-11-19 LAB — BASIC METABOLIC PANEL
BUN: 8 mg/dL (ref 6–23)
Chloride: 107 mEq/L (ref 96–112)
Glucose, Bld: 94 mg/dL (ref 70–99)
Potassium: 3.7 mEq/L (ref 3.5–5.1)
Sodium: 141 mEq/L (ref 135–145)

## 2010-11-19 LAB — OCCULT BLOOD X 1 CARD TO LAB, STOOL
Fecal Occult Bld: NEGATIVE
Fecal Occult Bld: NEGATIVE
Fecal Occult Bld: NEGATIVE

## 2010-11-19 MED ORDER — IOHEXOL 300 MG/ML  SOLN
80.0000 mL | Freq: Once | INTRAMUSCULAR | Status: AC | PRN
  Administered 2010-11-19: 80 mL via INTRAVENOUS

## 2010-11-20 LAB — BASIC METABOLIC PANEL
BUN: 6 mg/dL (ref 6–23)
GFR calc non Af Amer: >60 mL/min (ref >60)
Glucose, Bld: 99 mg/dL (ref 70–99)
Potassium: 3.1 mEq/L — ABNORMAL LOW (ref 3.5–5.1)

## 2010-11-21 ENCOUNTER — Other Ambulatory Visit (INDEPENDENT_AMBULATORY_CARE_PROVIDER_SITE_OTHER): Payer: Self-pay | Admitting: General Surgery

## 2010-11-21 ENCOUNTER — Inpatient Hospital Stay (HOSPITAL_COMMUNITY): Payer: Medicare Other

## 2010-11-21 DIAGNOSIS — I959 Hypotension, unspecified: Secondary | ICD-10-CM

## 2010-11-21 DIAGNOSIS — K810 Acute cholecystitis: Secondary | ICD-10-CM

## 2010-11-21 DIAGNOSIS — I4891 Unspecified atrial fibrillation: Secondary | ICD-10-CM

## 2010-11-21 HISTORY — PX: CHOLECYSTECTOMY: SHX55

## 2010-11-21 LAB — CARDIAC PANEL(CRET KIN+CKTOT+MB+TROPI)
CK, MB: 2.1 ng/mL (ref 0.3–4.0)
Relative Index: INVALID (ref 0.0–2.5)
Troponin I: <0.3 ng/mL (ref <0.30)

## 2010-11-21 LAB — COMPREHENSIVE METABOLIC PANEL
ALT: 30 U/L (ref 0–35)
BUN: 4 mg/dL — ABNORMAL LOW (ref 6–23)
Calcium: 8.7 mg/dL (ref 8.4–10.5)
GFR calc Af Amer: >60 mL/min (ref >60)
Glucose, Bld: 96 mg/dL (ref 70–99)
Sodium: 144 mEq/L (ref 135–145)
Total Protein: 5.7 g/dL — ABNORMAL LOW (ref 6.0–8.3)

## 2010-11-21 LAB — CBC
HCT: 28.8 % — ABNORMAL LOW (ref 36.0–46.0)
Hemoglobin: 11.5 g/dL — ABNORMAL LOW (ref 12.0–15.0)
Hemoglobin: 9.2 g/dL — ABNORMAL LOW (ref 12.0–15.0)
MCH: 29.1 pg (ref 26.0–34.0)
MCHC: 32.8 g/dL (ref 30.0–36.0)
RBC: 3.19 MIL/uL — ABNORMAL LOW (ref 3.87–5.11)
WBC: 28.8 10*3/uL — ABNORMAL HIGH (ref 4.0–10.5)

## 2010-11-22 LAB — CARDIAC PANEL(CRET KIN+CKTOT+MB+TROPI)
CK, MB: 2.6 ng/mL (ref 0.3–4.0)
Relative Index: INVALID (ref 0.0–2.5)
Relative Index: INVALID (ref 0.0–2.5)
Troponin I: <0.3 ng/mL (ref <0.30)
Troponin I: <0.3 ng/mL (ref <0.30)

## 2010-11-22 LAB — CBC
HCT: 19.2 % — ABNORMAL LOW (ref 36.0–46.0)
Hemoglobin: 6.4 g/dL — CL (ref 12.0–15.0)
Hemoglobin: 8.8 g/dL — ABNORMAL LOW (ref 12.0–15.0)
Platelets: 221 10*3/uL (ref 150–400)
RBC: 2.93 MIL/uL — ABNORMAL LOW (ref 3.87–5.11)
RDW: 13.3 % (ref 11.5–15.5)
WBC: 17.6 10*3/uL — ABNORMAL HIGH (ref 4.0–10.5)
WBC: 20.8 10*3/uL — ABNORMAL HIGH (ref 4.0–10.5)

## 2010-11-22 LAB — APTT: aPTT: 32 seconds (ref 24–37)

## 2010-11-22 LAB — BASIC METABOLIC PANEL
BUN: 11 mg/dL (ref 6–23)
Chloride: 111 mEq/L (ref 96–112)
GFR calc Af Amer: >60 mL/min (ref >60)
Potassium: 4.1 mEq/L (ref 3.5–5.1)
Sodium: 141 mEq/L (ref 135–145)

## 2010-11-22 LAB — CULTURE, BLOOD (ROUTINE X 2): Culture: NO GROWTH

## 2010-11-22 LAB — DIGOXIN LEVEL: Digoxin Level: 2.4 ng/mL — ABNORMAL HIGH (ref 0.8–2.0)

## 2010-11-22 LAB — PREPARE PLATELET PHERESIS: Unit division: 0

## 2010-11-22 LAB — PROTIME-INR: INR: 1.19 (ref 0.00–1.49)

## 2010-11-23 ENCOUNTER — Inpatient Hospital Stay (HOSPITAL_COMMUNITY): Payer: Medicare Other

## 2010-11-23 LAB — CROSSMATCH
ABO/RH(D): O POS
Antibody Screen: NEGATIVE
Unit division: 0

## 2010-11-23 LAB — COMPREHENSIVE METABOLIC PANEL
AST: 35 U/L (ref 0–37)
Albumin: 2.4 g/dL — ABNORMAL LOW (ref 3.5–5.2)
Calcium: 8.2 mg/dL — ABNORMAL LOW (ref 8.4–10.5)
Creatinine, Ser: 0.88 mg/dL (ref 0.50–1.10)
GFR calc non Af Amer: >60 mL/min (ref >60)
Total Protein: 5.1 g/dL — ABNORMAL LOW (ref 6.0–8.3)

## 2010-11-23 LAB — CBC
MCHC: 33.3 g/dL (ref 30.0–36.0)
Platelets: 228 10*3/uL (ref 150–400)
RDW: 13.7 % (ref 11.5–15.5)

## 2010-11-23 LAB — PROTIME-INR
INR: 1.32 (ref 0.00–1.49)
Prothrombin Time: 16.6 seconds — ABNORMAL HIGH (ref 11.6–15.2)

## 2010-11-24 LAB — CBC
Hemoglobin: 8 g/dL — ABNORMAL LOW (ref 12.0–15.0)
MCH: 29.9 pg (ref 26.0–34.0)
RBC: 2.68 MIL/uL — ABNORMAL LOW (ref 3.87–5.11)

## 2010-11-24 LAB — BASIC METABOLIC PANEL
CO2: 25 mEq/L (ref 19–32)
Calcium: 8.3 mg/dL — ABNORMAL LOW (ref 8.4–10.5)
Glucose, Bld: 117 mg/dL — ABNORMAL HIGH (ref 70–99)
Sodium: 141 mEq/L (ref 135–145)

## 2010-11-25 ENCOUNTER — Other Ambulatory Visit (HOSPITAL_COMMUNITY): Payer: Medicare Other

## 2010-11-25 LAB — CBC
HCT: 25.2 % — ABNORMAL LOW (ref 36.0–46.0)
Hemoglobin: 8.3 g/dL — ABNORMAL LOW (ref 12.0–15.0)
MCH: 30.4 pg (ref 26.0–34.0)
MCV: 92.3 fL (ref 78.0–100.0)
RBC: 2.73 MIL/uL — ABNORMAL LOW (ref 3.87–5.11)

## 2010-11-25 LAB — BASIC METABOLIC PANEL
BUN: 8 mg/dL (ref 6–23)
CO2: 25 mEq/L (ref 19–32)
Chloride: 112 mEq/L (ref 96–112)
Glucose, Bld: 105 mg/dL — ABNORMAL HIGH (ref 70–99)
Potassium: 3.9 mEq/L (ref 3.5–5.1)

## 2010-11-25 LAB — DIFFERENTIAL
Eosinophils Absolute: 0.1 10*3/uL (ref 0.0–0.7)
Lymphs Abs: 1.9 10*3/uL (ref 0.7–4.0)
Monocytes Relative: 9 % (ref 3–12)
Neutro Abs: 10.7 10*3/uL — ABNORMAL HIGH (ref 1.7–7.7)
Neutrophils Relative %: 77 % (ref 43–77)

## 2010-11-26 ENCOUNTER — Inpatient Hospital Stay (HOSPITAL_COMMUNITY): Payer: Medicare Other

## 2010-11-26 LAB — COMPREHENSIVE METABOLIC PANEL
ALT: 32 U/L (ref 0–35)
CO2: 25 mEq/L (ref 19–32)
Calcium: 8.6 mg/dL (ref 8.4–10.5)
Chloride: 112 mEq/L (ref 96–112)
Creatinine, Ser: 0.9 mg/dL (ref 0.50–1.10)
GFR calc Af Amer: 67 mL/min — ABNORMAL LOW (ref >90)
GFR calc non Af Amer: 58 mL/min — ABNORMAL LOW (ref >90)
Glucose, Bld: 104 mg/dL — ABNORMAL HIGH (ref 70–99)
Total Bilirubin: 1.3 mg/dL — ABNORMAL HIGH (ref 0.3–1.2)

## 2010-11-26 LAB — CBC
Hemoglobin: 8.6 g/dL — ABNORMAL LOW (ref 12.0–15.0)
MCH: 29.7 pg (ref 26.0–34.0)
MCV: 93.4 fL (ref 78.0–100.0)
RBC: 2.9 MIL/uL — ABNORMAL LOW (ref 3.87–5.11)

## 2010-11-26 LAB — BODY FLUID CELL COUNT WITH DIFFERENTIAL
Lymphs, Fluid: 3 %
Monocyte-Macrophage-Serous Fluid: 42 % — ABNORMAL LOW (ref 50–90)
Neutrophil Count, Fluid: 53 % — ABNORMAL HIGH (ref 0–25)

## 2010-11-26 LAB — GLUCOSE, SEROUS FLUID: Glucose, Fluid: 107 mg/dL

## 2010-11-26 NOTE — H&P (Signed)
Brooke Griffin, Brooke Griffin              ACCOUNT NO.:  [id number]  MEDICAL RECORD NO.:  [id number]  LOCATION:  [facility]                         FACILITY:  [facility]  PHYSICIAN:  Carlota Raspberry, MD         DATE OF BIRTH:  03-14-1925  DATE OF ADMISSION:  11/15/2010 DATE OF DISCHARGE:                             HISTORY & PHYSICAL   PRIMARY CARE PHYSICIAN:  Loyal Jacobson, MD  CHIEF COMPLAINT:  Malaise, poor p.o. intake, weakness and bilateral lower quadrant abdominal pain.  HISTORY OF PRESENT ILLNESS:  This 77 year old female who has a history of hypertension, AFib not on Coumadin, diverticulosis, CVA and dementia comes to the ED with pain in her bilateral lower quadrants that is migratory and radiating to her right flank, weakness and decreased p.o. intake since Thursday.  The patient herself is not much of a historian, but her daughter is able to relate this.  Starting Thursday, she started feeling unwell and also vomited some clear liquids.  She has been s[place].  She has been unable to get out of bed and has had poor p.o. intake and per the daughter, has poorly descried bilateral lower quadrant pain possibly radiating around into the right posterior lateral aspect of her stomach as well.  The daughter denies any frank fevers, chills or night sweats.  She was brought to the emergency room where she was found to be afebrile at 97.9 with blood pressure of 101/51 and a pulse of 87.  Through her course in the emergency room, she had an abdominal ultrasound concerning for cholecystitis and also appears to have urinary tract infection and a leukocytosis to 18.2.  She also appears to be hemoconcentrated at hematocrit of 42.9, which is above her baseline of 27-28.  Otherwise, she also had a chest x-ray with a incidental 1 cm left lower lobe nodule.  Per discussion with the ED, Surgery has evaluated the patient and will possibly operate, but they are requesting Medicine admission given the patient's  numerous comorbidities.  REVIEW OF SYSTEMS:  As above, otherwise, the patient's daughter states that there are no cardiopulmonary symptoms at present.  She has a history of diverticulosis, but no current GI bleed.  No fall, dizziness, lightheadedness and no dysuria.  She does have a nonproductive cough that is longstanding, otherwise, review of systems are unremarkable.  PAST MEDICAL HISTORY: 1. Hypertension. 2. GI bleed secondary to diverticulosis with recent admission in July     2012 also showing colonic polyps. 3. Internal hemorrhoids. 4. Hospital-acquired delirium. 5. Dementia. 6. Chronic atrial fibrillation on Aggrenox, but not Coumadin due to     history of falls and bruising. 7. History of stroke. 8. Spinal stenosis.  MEDICATION:  List reconciled by pharmacy includes; 1. Atenolol 50 mg daily. 2. Lisinopril 40 mg daily. 3. Digoxin 0.25 mg daily. 4. Aggrenox 25/200 daily.  ALLERGIES:  There are no known drug allergies.  SOCIAL HISTORY:  The patient lives with her daughter in [place], but she is currently still smoking half-pack per day.  She socially drinks alcohol, but has not had any for several weeks now.  She does not do any drugs.  She appears to have dementia, but is  still able to have minimal conversation.  PHYSICAL EXAMINATION:  VITAL SIGNS:  Blood pressure 93/71 ranging 87-116 on the ER monitor, pulse 75, 97%, 17. GENERAL:  She is an elderly female who does not appear in distress.  She is alert, following the conversation, able to answer simple questions, but not really able to fully explain why she is here.  She does not appear acutely toxic. HEENT:  Pupils are equal, round, reactive to light and accommodation. Her extraocular muscles are intact.  Her sclerae have some mudding, but overall clear.  Her mouth is not too dry appearing, fairly normal. NECK:  Supple and thin.  No cervical lymphadenopathy. LUNGS:  Clear to auscultation bilaterally with no  wheezes, crackles, rales or rhonchi. HEART:  Irregular, but it is not tachycardic.  There are no gross murmurs or gallops appreciated. ABDOMEN:  Soft, non-peritoneal, nondistended.  She does have some minimal facial grimacing to very deep palpation over her bladder, but she does have a negative Murphy sign and her right upper quadrant is fairly unremarkable. EXTREMITIES:  Warm and well perfused with no coolness, clubbing or cyanosis.  There is no bilateral lower extremity edema.  She is able to move her extremities spontaneously. NEURO:  Fairly nonfocal other than her attention and ability to have full conversation, which is fairly nonfocal.  LABORATORY DATA:  White blood cell count 18.2, hematocrit 42.9 with a baseline of 27-28, platelets 167.  Chemistry panel normal including renal 26 and 0.92 which is just a bit above baseline.  LFTs show an AST minimally up at 39, T bili is 1.6.  Her alk phos is 55, otherwise her LFTs are normal.  Albumin is 3.3.  Her calcium is 9.3, lactate is 1.1, lipase 18.  Troponin is negative, dig is 1.5.  UA with moderate bilirubinemia, 15 ketonuria, 2.0 urobilinogen, positive nitrites, small leukocyte esterase, 0-2 WBCs, rare bacteria and urine culture is pending.  RADIOGRAPHIC DATA:  Chest x-ray impression shows 1 cm nodule in the superior segmental left lower lobe suspicious for malignancy with CT chest with contrast recommended COPD/emphysema with no acute cardiopulmonary disease and mild cardiomegaly without pulmonary edema.  Abdominal ultrasound shows gallstones, gallbladder wall thickening and pericholecystic fluid concerning for cholecystitis and renal cysts.  EKG shows atrial fibrillation overall rate of 72 beats per minute. There is one long pause followed by V-paced beat, it is normal axis with a left bundle type pattern for the native beats.  There does not appear to be any acute ischemic changes and it is overall fairly unchanged compared  to prior.  IMPRESSION:  This 77 year old female with a history of atrial fibrillation not on Coumadin, dementia, stroke, hypertension, and diverticulosis presents with leukocytosis and borderline low blood pressures with potential infectious etiologies of urinary tract infection and cholecystitis. 1. Hypertension.  The daughter states her baseline blood pressures are     110.  She is currently 80-100, but mentating at baseline and making     urine output as of couple hours ago.  I suspect this is due to poor     p.o. intake given her ketonuria and hemoconcentrated hematocrit and     renal function just a bit above baseline.  She does not look     floridly septic to me despite her elevated white blood cell count.     Therefore at present, we will bolus her 2 L of IV fluids and     continue with IV antibiotics and watch her carefully. 2. Leukocytosis.  She appears to have cholecystitis and urinary tract     infection.  The pain in her lower quadrants and over her bladder is     explained by the urinary tract infection, but the cholecystitis by     imaging is not too severe at least as evidenced by her LFTs and     fairly normal alk phos.  We will cover with empiric Zosyn for broad     GNR and anaerobic coverage, which will likely cover her urine as     well.  Follow up with urine culture.  We will get one blood culture     for surveillance. 3. Cholecystitis.  Follow up Surgery recommendations regarding     operative management. 4. Lung nodule.  I informed the patient and daughter of this.  She is     a long-time smoker and still active, it is 1 cm in left lower lobe     and will need a CT chest with contrast urgently to fully evaluate. 5. Atrial fibrillation.  We will hold her home atenolol, lisinopril     and digoxin given her hypotension, but continue her Aggrenox.  The     daughter states she is not Coumadin for falls and bruising.  Her     EKG does not appear ischemic at present. 6.  Fluid, electrolytes and nutrition.  IV fluids as above.  We will     keep her n.p.o. for now, but if she requests we can try little bit     of clear liquids. 7. IV access.  She has small bore in her left arm and was just     replaced. 8. Prophylaxis.  Subcutaneous heparin, Colace, senna and Tylenol for     pain.  CODE STATUS:  She is full code for now as I discussed with her daughter.  The patient will be admitted to regular bed, [facility] Team 7.          ______________________________ Carlota Raspberry, MD     EB/MEDQ  D:  11/15/2010  T:  11/16/2010  Job:  [id number]  Electronically Signed by Carlota Raspberry MD on 11/26/2010 12:36:19 PM

## 2010-11-27 LAB — BASIC METABOLIC PANEL
BUN: 12 mg/dL (ref 6–23)
CO2: 24 mEq/L (ref 19–32)
Chloride: 111 mEq/L (ref 96–112)
Glucose, Bld: 123 mg/dL — ABNORMAL HIGH (ref 70–99)
Potassium: 3.7 mEq/L (ref 3.5–5.1)
Sodium: 142 mEq/L (ref 135–145)

## 2010-11-27 LAB — CBC
HCT: 28 % — ABNORMAL LOW (ref 36.0–46.0)
MCH: 29.5 pg (ref 26.0–34.0)
MCHC: 31.4 g/dL (ref 30.0–36.0)
MCV: 94 fL (ref 78.0–100.0)
Platelets: 393 10*3/uL (ref 150–400)
RDW: 16.1 % — ABNORMAL HIGH (ref 11.5–15.5)
WBC: 19.3 10*3/uL — ABNORMAL HIGH (ref 4.0–10.5)

## 2010-11-28 ENCOUNTER — Inpatient Hospital Stay (HOSPITAL_COMMUNITY): Payer: Medicare Other

## 2010-11-28 LAB — CBC
HCT: 26.6 % — ABNORMAL LOW (ref 36.0–46.0)
Hemoglobin: 8.5 g/dL — ABNORMAL LOW (ref 12.0–15.0)
MCH: 29.9 pg (ref 26.0–34.0)
MCV: 93.7 fL (ref 78.0–100.0)
Platelets: 390 10*3/uL (ref 150–400)
RBC: 2.84 MIL/uL — ABNORMAL LOW (ref 3.87–5.11)
WBC: 19.4 10*3/uL — ABNORMAL HIGH (ref 4.0–10.5)

## 2010-11-28 LAB — BASIC METABOLIC PANEL
BUN: 16 mg/dL (ref 6–23)
CO2: 25 mEq/L (ref 19–32)
Calcium: 8.5 mg/dL (ref 8.4–10.5)
Chloride: 111 mEq/L (ref 96–112)
Creatinine, Ser: 0.79 mg/dL (ref 0.50–1.10)
Glucose, Bld: 112 mg/dL — ABNORMAL HIGH (ref 70–99)

## 2010-11-28 MED ORDER — IOHEXOL 300 MG/ML  SOLN
100.0000 mL | Freq: Once | INTRAMUSCULAR | Status: AC | PRN
  Administered 2010-11-28: 100 mL via INTRAVENOUS

## 2010-11-29 LAB — COMPREHENSIVE METABOLIC PANEL
Albumin: 2.3 g/dL — ABNORMAL LOW (ref 3.5–5.2)
BUN: 16 mg/dL (ref 6–23)
CO2: 23 mEq/L (ref 19–32)
Calcium: 8.5 mg/dL (ref 8.4–10.5)
Chloride: 107 mEq/L (ref 96–112)
Creatinine, Ser: 0.73 mg/dL (ref 0.50–1.10)
GFR calc non Af Amer: 77 mL/min — ABNORMAL LOW (ref >90)
Total Bilirubin: 2 mg/dL — ABNORMAL HIGH (ref 0.3–1.2)

## 2010-11-29 LAB — CBC
HCT: 27.4 % — ABNORMAL LOW (ref 36.0–46.0)
MCH: 29.9 pg (ref 26.0–34.0)
MCV: 94.2 fL (ref 78.0–100.0)
RDW: 16.5 % — ABNORMAL HIGH (ref 11.5–15.5)
WBC: 18.4 10*3/uL — ABNORMAL HIGH (ref 4.0–10.5)

## 2010-11-29 NOTE — Consult Note (Signed)
Brooke Griffin, BRENN              ACCOUNT NO.:  [id number]  MEDICAL RECORD NO.:  [id number]  LOCATION:  3314                         FACILITY:  [facility]  PHYSICIAN:  Coralyn Helling, MD        DATE OF BIRTH:  05-02-1925  DATE OF CONSULTATION:  11/21/2010 DATE OF DISCHARGE:                                CONSULTATION   REFERRING PHYSICIAN:  Isidor Holts, MD  REASON FOR CONSULTATION:  Hypertension.  CHIEF COMPLAINT:  Abdominal pain.  HISTORY OF PRESENT ILLNESS:  Brooke Griffin is an 77 year old female who was admitted to the Hospitalist Service on November 15, 2010, with right upper quadrant pain.  She had an abdominal ultrasound, which showed findings concerning for acute cholecystitis.  She was seen by the Surgical Service and they recommended that she needed further cardiac evaluation prior to proceeding with surgery.  She was evaluated by Cardiology and had a nuclear stress test which showed a fixed inferior lateral wall defect compatible with a remote infarct but no areas of reversibility and ejection fraction of 66%.  She was then scheduled to have a laparoscopic cholecystectomy, which was done on the morning of November 21, 2010.  Her additional postoperative course was apparently uneventful.  However, on return to the surgical floor, she was noted to be somnolent and hypotensive.  She was given 1.5 liters of intravenous fluid and her blood pressure increased to 101/60 and her mental status also improved.  She was noted to be tachycardic with an irregular heart rate and heart rate in the 140s.  She is scheduled to be transferred to the step-down unit.  Critical Care consultation was requested to further assess her hypotension.  She currently denies dizziness.  She does feel like her heart is beating little bit fast.  She has mild chest discomfort but denies any significant abdominal pain.  PAST MEDICAL HISTORY:  Significant for: 1. Diverticulosis. 2. Lower GI bleeding. 3.  Colon polyposis 4. Atrial fibrillation. 5. Coronary artery disease. 6. Status post pacemaker insertion. 7. Dementia. 8. Previous CVA. 9. Hypertension. 10.Spinal stenosis. 11.Status post hysterectomy.  FAMILY HISTORY:  Significant for heart disease and hypertension.  SOCIAL HISTORY:  She is a smoker and has occasional alcohol use.  She lives with her daughter.  ALLERGIES:  No known drug allergies.  OUTPATIENT MEDICATIONS: 1. Aggrenox 1 tablet daily. 2. Atenolol 50 mg daily. 3. Digoxin 0.25 mg daily. 4. Lisinopril 40 mg daily.  CURRENT INPATIENT MEDICATIONS: 1. Metoprolol 25 mg b.i.d. 2. Digoxin 0.25 mg daily. 3. Potassium chloride 40 mEq daily. 4. Zosyn per Pharmacy.  Of note is that she did not receive her oral medications today in preparation for surgery.  IMAGING AND LABORATORIES:  CT scan of the chest from November 19, 2010, showed changes of acute calculous cholecystitis.  Sodium is 144, potassium is 3.3, chloride is 110, CO2 is 28, BUN is 4, creatinine 0.89. WBC is 28.9, hemoglobin is 9.2, hematocrit is 28.8, platelet count is 269.  IMPRESSION:  An 77 year old female who was found to have acute cholecystitis and she is now status post laparoscopic cholecystectomy. She developed postoperative hypotension which has responded to fluid. She is also in atrial fibrillation with rapid  ventricular response.  I would recommend continuing intravenous fluid with close monitoring of her oxygenation.  I will defer rate control of her atrial fibrillation to the Primary Service and Cardiology.  I would recommend continue her on antibiotics.  I would follow up on her hemoglobin closely and if she has continued decrease in her hemoglobin would recommend consideration for transfusion, particularly if we have more difficulty maintaining her blood pressure.  We will follow up on her lactic acid and cardiac enzymes, however, given the fact that she does have initial response to her  blood pressure with intravenous fluids, I think it would be reasonable to have her transferred to a step-down unit at the intensive care unit.  Critical Care Service will follow up when she is transferred to step-down unit.     Coralyn Helling, MD     VS/MEDQ  D:  11/21/2010  T:  11/22/2010  Job:  [id number]  Electronically Signed by Coralyn Helling MD on 11/29/2010 05:16:24 PM

## 2010-11-30 LAB — CBC
Hemoglobin: 8.8 g/dL — ABNORMAL LOW (ref 12.0–15.0)
Platelets: 431 10*3/uL — ABNORMAL HIGH (ref 150–400)
RBC: 2.88 MIL/uL — ABNORMAL LOW (ref 3.87–5.11)
WBC: 13.6 10*3/uL — ABNORMAL HIGH (ref 4.0–10.5)

## 2010-11-30 LAB — BASIC METABOLIC PANEL
BUN: 15 mg/dL (ref 6–23)
CO2: 22 mEq/L (ref 19–32)
Calcium: 8.6 mg/dL (ref 8.4–10.5)
Chloride: 108 mEq/L (ref 96–112)
Creatinine, Ser: 0.89 mg/dL (ref 0.50–1.10)
GFR calc Af Amer: 68 mL/min — ABNORMAL LOW (ref >90)
GFR calc non Af Amer: 59 mL/min — ABNORMAL LOW (ref >90)
Glucose, Bld: 106 mg/dL — ABNORMAL HIGH (ref 70–99)
Sodium: 141 mEq/L (ref 135–145)

## 2010-11-30 LAB — BODY FLUID CULTURE

## 2010-12-01 ENCOUNTER — Inpatient Hospital Stay (HOSPITAL_COMMUNITY): Payer: Medicare Other

## 2010-12-01 LAB — CBC
MCH: 29.4 pg (ref 26.0–34.0)
MCHC: 31.3 g/dL (ref 30.0–36.0)
Platelets: 426 10*3/uL — ABNORMAL HIGH (ref 150–400)
RDW: 16.7 % — ABNORMAL HIGH (ref 11.5–15.5)

## 2010-12-01 LAB — COMPREHENSIVE METABOLIC PANEL
ALT: 52 U/L — ABNORMAL HIGH (ref 0–35)
Albumin: 2.2 g/dL — ABNORMAL LOW (ref 3.5–5.2)
Alkaline Phosphatase: 55 U/L (ref 39–117)
Calcium: 8.6 mg/dL (ref 8.4–10.5)
GFR calc Af Amer: 67 mL/min — ABNORMAL LOW (ref >90)
Potassium: 3.7 mEq/L (ref 3.5–5.1)
Sodium: 142 mEq/L (ref 135–145)
Total Protein: 5.5 g/dL — ABNORMAL LOW (ref 6.0–8.3)

## 2010-12-03 LAB — COMPREHENSIVE METABOLIC PANEL
Albumin: 2.3 g/dL — ABNORMAL LOW (ref 3.5–5.2)
Alkaline Phosphatase: 64 U/L (ref 39–117)
BUN: 15 mg/dL (ref 6–23)
Chloride: 107 mEq/L (ref 96–112)
Creatinine, Ser: 1.19 mg/dL — ABNORMAL HIGH (ref 0.50–1.10)
GFR calc Af Amer: 48 mL/min — ABNORMAL LOW (ref >90)
Glucose, Bld: 94 mg/dL (ref 70–99)
Potassium: 4.5 mEq/L (ref 3.5–5.1)
Total Bilirubin: 1.1 mg/dL (ref 0.3–1.2)
Total Protein: 5.8 g/dL — ABNORMAL LOW (ref 6.0–8.3)

## 2010-12-03 LAB — CBC
HCT: 30.7 % — ABNORMAL LOW (ref 36.0–46.0)
Hemoglobin: 9.4 g/dL — ABNORMAL LOW (ref 12.0–15.0)
MCHC: 30.6 g/dL (ref 30.0–36.0)
MCV: 96.8 fL (ref 78.0–100.0)
RDW: 17 % — ABNORMAL HIGH (ref 11.5–15.5)

## 2010-12-03 LAB — VANCOMYCIN, TROUGH: Vancomycin Tr: 13.1 ug/mL (ref 10.0–20.0)

## 2010-12-03 NOTE — Progress Notes (Signed)
Brooke Griffin, Brooke Griffin              ACCOUNT NO.:  [id number]  MEDICAL RECORD NO.:  [id number]  LOCATION:  3314                         FACILITY:  [facility]  PHYSICIAN:  Erick Blinks, MD     DATE OF BIRTH:  05-11-1925                                PROGRESS NOTE   CURRENT PROBLEM LIST: 1. Acute cholecystitis status post laparoscopic cholecystectomy. 2. Acute blood loss anemia, postoperatively, stable. 3. Hypotension secondary to acute blood loss anemia, improved. 4. Leukocytosis, possibly demargination secondary to acute anemia,     with slow improvement. 5. Elevated lactic acid likely secondary to transient hypotension. 6. Atrial fibrillation with rapid ventricular response, improved, not     a candidate for Coumadin due to falls and recent anemia. 7. History of cerebrovascular accident. 8. History of hypertension. 9. Right-sided pleural effusion.  HISTORY OF PRESENT ILLNESS: This is an 77 year old female, who comes to the emergency room with complaints of bilateral lower quadrant abdominal pain as well as right upper quadrant pain.  The patient is accompanied by her daughter.  She reports that her discomfort started on Thursday.  She was feeling unwell and also vomited clear liquids.  She was brought to the emergency room where she had an abdominal ultrasound concerning for cholecystitis.  She also was found to have leukocytosis of 18,000 and was found to be somewhat dehydrated.  The patient was apparently admitted to the hospital for further evaluation.  For details, please refer to the history and physical dictated by Dr. Kaylyn Layer.  HOSPITAL COURSE: 1. Severe acute cholecystitis.  The patient was seen in consultation     by [facility] Surgery and after cardiac clearance, she     underwent laparoscopic cholecystectomy.  The patient tolerated this     procedure, but postop was noted to have a significant anemia.  She     was then followed by the Surgical Service.  Her diet  has continued     to be advanced to a regular diet. 2. Acute blood loss anemia.  The patient had a drop in her hemoglobin     from 11.7, postoperatively down to 6.4.  She was transfused a total     of 2 units PRBCs and her hemoglobin responded appropriately to 8.8.     Currently, it is ranging between 8 and 8.8.  Her bleeding seems to     have controlled at this point, but will need further followup.  Her     Aggrenox which she was taking post CVA has currently been held. 3. Atrial fibrillation with rapid ventricular response.  This was     likely secondary in response to her acute anemia.  She was seen in     consultation by Dr. Sharyn Lull and placed on digoxin as well as     amiodarone.  She is currently rate controlled, again not a     candidate for Coumadin.  Of note, the patient did have a stress     test done during this hospitalization for surgical clearance.  It     did show a fixed inferolateral wall defect compatible with the     remote infarct and ejection fraction was 66%.  No reversible     defects. 4. Hypotension.  The patient did have an episode of hypotension, which     did not initially respond to IV fluids.  This was postoperatively     and she was transferred to the step-down unit for closer     monitoring.  She was noted also to be tachycardic.  Lactic acid was     found to be elevated.  Her white count was also found to be     elevated as well.  There does not appear clear source of infection     at this time.  She has been continued on Zosyn, which was started     on admission.  Blood culture has been nonrevealing.  She was     continued on Zosyn, but her hypotension was likely secondary to her     acute blood loss which has subsequently improved with transfusion     of blood products. 5. Right pleural effusion with aggressive IV hydration for     hypotension.  She has developed a moderate right-sided pleural     effusion.  She does have a bit of a cough at this time.   The     patient is maintaining her oxygen saturations on room air, but does     have some increased work of breathing since her effusion is     unilateral.  I think she may benefit from a thoracentesis to drain     the fluid and provide symptomatic relief and this should be done. 6. Leukocytosis.  Again may be secondary to demargination from her     hypotension and acute blood loss anemia, although this has been     slow to improve.  We will recent cultures at this point and follow     closely.  She is currently on Zosyn.  She has not had a significant     fever, therefore, we will not escalate antibiotics any further. 7. Disposition.  Plan will eventually be to discharge the patient to     home with home health services.  At this time, it is felt that she     is stable enough to transition back to a telemetry unit to continue     her medical care.  We anticipate that she should be ready for     discharge in the next 48-72 hours.  CONSULTATIONS: 1. Cardiology, Dr. Eduardo Osier. Harwani. 2. [facility].  PROCEDURES: 1. Laparoscopic cholecystectomy with intraoperative cholangiogram. 2. Diagnostic imaging.  DIAGNOSTICS/IMAGING: 1. Chest x-ray on admission shows a proximal 1 cm nodule, which I     believe is in the superior segment of left lower lobe.  This is     suspicious for malignancy. 2. Ultrasound of abdomen on November 15, 2010, shows examination is     positive for gallstones, gallbladder wall thickening, and     pericholecystic fluid finding concerning for cholecystitis. 3. Stress test on November 17, 2010, shows fixed inferolateral wall     defect compatible with a remote infarct.  Normal contractility and     wall motion through the remaining segments.  No reversibility.     Normal function with an estimated ejection fraction of 66%. 4. CT of chest with contrast on November 19, 2010, shows no worrisome     pulmonary nodules or mass.  The abnormality on chest  x-ray with a     benign-appearing bone island in the posterior rib, advanced  atherosclerotic changes involving the aorta and branch vessels,     acute calculous cholecystitis. 5. Intraoperative cholangiogram shows a negative intraoperative     cholangiogram. 6. Chest x-ray on November 23, 2010, shows new moderate right pleural     effusion, basilar atelectasis on the left.     Erick Blinks, MD     JM/MEDQ  D:  11/24/2010  T:  11/24/2010  Job:  [id number]  Electronically Signed by Durward Mallard Lorae Roig  on 12/03/2010 12:20:06 AM

## 2010-12-04 LAB — CBC
MCHC: 30.8 g/dL (ref 30.0–36.0)
Platelets: 483 10*3/uL — ABNORMAL HIGH (ref 150–400)
RDW: 16.8 % — ABNORMAL HIGH (ref 11.5–15.5)
WBC: 11.9 10*3/uL — ABNORMAL HIGH (ref 4.0–10.5)

## 2010-12-04 LAB — BASIC METABOLIC PANEL
GFR calc Af Amer: 61 mL/min — ABNORMAL LOW (ref >90)
GFR calc non Af Amer: 53 mL/min — ABNORMAL LOW (ref >90)
Potassium: 4.1 mEq/L (ref 3.5–5.1)
Sodium: 141 mEq/L (ref 135–145)

## 2010-12-09 NOTE — Consult Note (Signed)
  [name], Griffin              ACCOUNT NO.:  [id number]  MEDICAL RECORD NO.:  [id number]  LOCATION:  3036                         FACILITY:  [facility]  PHYSICIAN:  Almond Lint, MD       DATE OF BIRTH:  Jan 07, 1926  DATE OF CONSULTATION:  11/15/2010 DATE OF DISCHARGE:                                CONSULTATION   CHIEF COMPLAINT:  Abdominal pain.  HISTORY OF PRESENT ILLNESS:  [name] is a 77 year old female who has had around 2-3 days of epigastric and right upper quadrant pain.  She has not been able to eat fairly anything.  She has had nausea throughout this time and has not vomited.  She comes with her daughter because she is quite demented.  Daughter is able to assess part of the history and review of systems.  She has not been able to eat much for the last few days and has been quite weak.  She describes the pain is radiating to her right flank.  She has baseline weakness from her prior stroke that was exacerbated.  PAST MEDICAL HISTORY:  Hypertension, GI bleed, hemorrhoids, delirium, atrial fibrillation, CVA, and spinal stenosis.  PAST SURGICAL HISTORY:  TAH and a pacemaker.  FAMILY HISTORY:  Coronary artery disease and hypertension.  SOCIAL HISTORY:  She uses tobacco and alcohol occasionally.  She lives with her daughter in [place].  REVIEW OF SYSTEMS:  Positive for cough, otherwise negative other than the things included in the HPI and past medical history.  MEDICATION:  Digoxin, Aggrenox, lisinopril, and atenolol.  ALLERGIES:  None.  PHYSICAL EXAMINATION:  VITAL SIGNS:  Temperature 97.9, heart rate 87, blood pressure 100/51, respiratory rate 20, oxygen sats 98% on room air. GENERAL:  She is alert and oriented x3, in no acute distress. HEENT:  Normocephalic, atraumatic.  Sclerae are anicteric. NECK:  Supple.  No lymphadenopathy.  No thyromegaly.  Trachea is midline. HEART:  Irregularly irregular, audible. LUNGS:  Clear to auscultation bilaterally with some  faint expiratory wheezing. ABDOMEN:  Her right quadrant is slightly tender.  It appears to be slightly distended.  No signs of trauma. EXTREMITIES:  Warm and well perfused. NEURO:  She is oriented to the situation and appears to be hard of hearing.  She does not answers the specific questions when asked.  ASSESSMENT AND PLAN:  This is an 77 year old female with acute cholecystitis and possibly urinary tract infection.  We will keep her n.p.o. and keep her on IV antibiotics.  She will receive pain control and medicine and cardiology to evaluate for surgical candidacy.  She will follow initially in the OR sometime this week for laparoscopic appendectomy.     Almond Lint, MD     FB/MEDQ  D:  11/16/2010  T:  11/16/2010  Job:  [id number]  cc:   Loyal Jacobson, MD  Electronically Signed by Almond Lint MD on 12/09/2010 08:03:33 PM

## 2010-12-09 NOTE — Op Note (Signed)
Brooke Griffin, Brooke Griffin              ACCOUNT NO.:  [id number]  MEDICAL RECORD NO.:  [id number]  LOCATION:  3028                         FACILITY:  [facility]  PHYSICIAN:  Almond Lint, MD       DATE OF BIRTH:  Apr 25, 1925  DATE OF PROCEDURE:  11/21/2010 DATE OF DISCHARGE:                              OPERATIVE REPORT   PREOPERATIVE DIAGNOSIS:  Cholecystitis.  POSTOPERATIVE DIAGNOSIS:  Cholecystitis.  PROCEDURE:  Laparoscopic cholecystectomy with intraoperative cholangiogram.  SURGEON:  Almond Lint, MD  ASSISTANT:  Brooke Griffin. Brooke Morn, MD  ANESTHESIA:  General and local.  FINDINGS:  Severe cholecystitis.  SPECIMEN:  Gallbladder to Pathology.  ESTIMATED BLOOD LOSS:  100 mL.  COMPLICATIONS:  None known.  PROCEDURE:  Brooke Griffin was identified in the holding area and taken to the operating room where she was placed supine on the operating room table.  General anesthesia was induced.  Her abdomen was prepped and draped in a sterile fashion.  Time-out was performed according to surgical safety check list.  When all was correct, we continued.  The supraumbilical skin was anesthetized with local anesthetic and a vertical incision was made with an #11 blade.  This was done in order to avoid her prior scar tissue.  The subcutaneous tissue was divided with the [facility]  The midline fascia was elevated with two Kochers and incised in the midline with a #11 blade.  Brooke Griffin was used to confirm entrance into the peritoneal cavity.  A 0-Vicryl pursestring suture was placed around the incision and the Hasson was introduced in the abdomen.  The tails of the suture were used to hold the trocar in place. Pneumoperitoneum was achieved to a pressure of 15 mmHg.    The 11-mm trocar was placed in the epigastrium and two 5-mm trocars were placed in the right upper quadrant.  The gallbladder was extremely distended.  The sucker was used to aspirate the gallbladder and there was murky dark concentrated  bile.  The fundus was elevated towards the head. There were adhesions to the surface of the liver, which were freed up in order to allow this to happen more readily.  The infundibulum was not visible at this time.  Blunt dissection was used to take these adhesions down off the infundibulum.  This was grasped laterally.  Careful painstaking dissection was used to isolate the cystic artery.  This was doubly clipped on the patient's specimen side and divided.  The cystic duct stump was identified and this was painstakingly dissected.  The cystic duct was then clipped high up on the gallbladder and ductotomy was made with Endoshears.  The cholangiogram catheter was advanced to the abdominal wall and into the ductotomy.  This was clipped into place and flushed easily.  A cholangiogram was shot and this demonstrated filling of the right and left hepatic ducts as well as good drainage of bile into the duodenum without evidence of filling defect.  The patient was placed back in the correct position.  The cystic duct was then triply clipped on the patient's side after the cholangiogram catheter was removed.  This was divided.  The hook cautery was then used to take the gallbladder off the  gallbladder fossa.  There was another bleeding vessel that was doubly clipped.  The gallbladder was very difficult to take out as it was very intrahepatic.  Once it was removed, it was pulled out through the umbilical incision in an EndoCatch bag.    The fascial incision did have to be extended in order to pull this out.  The area was then re- irrigated.  There appeared to be more blood than it was normal and so this was carefully suctioned and irrigated in order to make sure there was no active source of bleeding from any of the omentum or the gallbladder fossa.  The gallbladder fossa was coagulated.  Four-quadrant inspection revealed adhesions of the lower incisions, but no other gross pathology.  Snow  hemostatic agent was placed in the gallbladder fossa. The trocars were removed and pneumoperitoneum was allowed to evacuate. The pursestring was tied down and there was a residual palpable fascial defect superiorly.  A figure-of-eight simple interrupted stitch were placed superiorly.  The skin of all incisions was closed with 4-0 Monocryl.  The wounds were cleaned, dried, and dressed with Dermabond. The patient was awakened from anesthesia and taken to the PACU in stable condition.  Needle, sponge, and instrument counts were correct.     Almond Lint, MD     FB/MEDQ  D:  11/21/2010  T:  11/21/2010  Job:  [id number]  Electronically Signed by Almond Lint MD on 12/09/2010 08:04:51 PM

## 2010-12-19 NOTE — Discharge Summary (Signed)
NAMEDALLYS, Brooke Griffin              ACCOUNT NO.:  [id number]  MEDICAL RECORD NO.:  [id number]  LOCATION:  3734                         FACILITY:  [facility]  PHYSICIAN:  Peggye Pitt, M.D. DATE OF BIRTH:  09/11/1925  DATE OF ADMISSION:  11/15/2010 DATE OF DISCHARGE:  12/04/2010                              DISCHARGE SUMMARY   PRIMARY CARE PHYSICIAN:  Loyal Jacobson, MD  DISCHARGE DIAGNOSES: 1. Right flank hematoma/cellulitis. 2. Acute cholecystitis status post laparoscopic cholecystectomy on     November 21, 2010. 3. Atrial fibrillation with rapid ventricular response, now rate     controlled. 4. Acute blood loss anemia, stable status post 2 units of packed red     blood cells. 5. Hypertension. 6. Right pleural effusion status post thoracentesis for therapeutic     reasons, has not recurred, now off oxygen.  DISCHARGE MEDICATIONS: 1. Amiodarone 200 mg every 8 hours p.r.n. 2. Digoxin 0.125 mg daily. 3. Colace 100 mg twice daily. 4. Doxycycline 100 mg twice daily for 14 days. 5. Vicodin 5/325 mg 1-2 tablets every 4 hours as needed for pain. 6. Metoprolol 25 mg every 6 hours. 7. Aggrenox 1 tablet daily. 8. Lisinopril 40 mg daily.  DISPOSITION AND FOLLOWUP:  Brooke Griffin will be discharged home today in stable and improved condition.  We have arranged for her to have PT and OT aid and nurse from home health therapy at the time of discharge.  She will need to follow up with her primary care physician in 2 weeks following discharge.  She will also need to follow up with Dr. Donell Beers with [facility] 3 weeks following discharge.  CONSULTATION THIS HOSPITALIZATION:  Dr. Donell Beers with [facility].  IMAGES AND PROCEDURES:  A chest x-ray on November 15, 2010, that showed COPD, emphysema, mild cardiomegaly.  CT chest with contrast on November 19, 2010, that showed no worrisome pulmonary nodules or masses.  Acute calculus cholecystitis.  Most recent chest  x-ray on December 01, 2010, shows decrease in volume of right pleural effusion after right thoracentesis, no pneumothorax, right basilar atelectasis.  HISTORY AND PHYSICAL:  For full details, please refer to dictation by Dr. Kaylyn Layer on November 16, 2010, however, in brief Brooke Griffin is a pleasant 77 year old African American woman who has a history of hypertension, atrial fibrillation, not maintained on chronic anticoagulation with Coumadin, and mild dementia who presented to the hospital with abdominal pain and malaise as well as weakness.  We were asked to admit her for further evaluation.  HOSPITAL COURSE BY PROBLEM: 1. Acute cholecystitis.  This culminated in laparoscopic     cholecystectomy.  Surgery has been following.  They need to see her     in the office in 3 weeks following discharge. 2. Right flank hematoma/cellulitis.  She has an indurated right flank.     CT scan of the abdomen and pelvis was done on November 28, 2010, that     showed asymmetric right flank subcutaneous fat stranding, edema,     and skin thickening worrisome for cellulitis.  At that point, she     was started on IV antibiotics consisting of vancomycin and Zosyn.     She has  never had leukocytosis or fever.  24 hours prior to     discharge, her IV antibiotics were discontinued and she was     transitioned over to p.o. doxycycline, which she seems to be     tolerating well.  She will need followup for this in the outpatient     setting. 3. Atrial fibrillation.  This is chronic for her.  She is now     maintained on chronic anticoagulation with Coumadin.     Postoperatively, she developed some anemia and received 2 units of     packed red blood cells.  Following this, her hemoglobin has     remained stable in the mid to high 9s. 4. Right pleural effusion.  Postoperatively, she developed a right     pleural effusion.  This was tapped for therapeutic reasons.  She     was significantly short of breath.  This has  resolved.  The fluid     has not reaccumulated. 5. Rest of chronic conditions are stable.  VITALS ON DAY OF DISCHARGE:  Blood pressure 126/70, heart rate 63, respirations 18, sats are 95% on room air, temp 97.9.     Peggye Pitt, M.D.     EH/MEDQ  D:  12/04/2010  T:  12/04/2010  Job:  [id number]  cc:   Loyal Jacobson, MD Almond Lint, MD  Electronically Signed by Peggye Pitt M.D. on 12/19/2010 03:40:21 PM

## 2010-12-29 ENCOUNTER — Encounter: Payer: Self-pay | Admitting: *Deleted

## 2010-12-29 ENCOUNTER — Encounter: Payer: Self-pay | Admitting: Cardiology

## 2010-12-29 DIAGNOSIS — F0390 Unspecified dementia without behavioral disturbance: Secondary | ICD-10-CM | POA: Insufficient documentation

## 2010-12-29 DIAGNOSIS — R072 Precordial pain: Secondary | ICD-10-CM | POA: Insufficient documentation

## 2010-12-29 DIAGNOSIS — T148XXA Other injury of unspecified body region, initial encounter: Secondary | ICD-10-CM | POA: Insufficient documentation

## 2010-12-29 DIAGNOSIS — J90 Pleural effusion, not elsewhere classified: Secondary | ICD-10-CM | POA: Insufficient documentation

## 2010-12-29 DIAGNOSIS — I10 Essential (primary) hypertension: Secondary | ICD-10-CM | POA: Insufficient documentation

## 2010-12-29 DIAGNOSIS — I4891 Unspecified atrial fibrillation: Secondary | ICD-10-CM | POA: Insufficient documentation

## 2010-12-29 DIAGNOSIS — T462X1A Poisoning by other antidysrhythmic drugs, accidental (unintentional), initial encounter: Secondary | ICD-10-CM | POA: Insufficient documentation

## 2010-12-29 DIAGNOSIS — F03C0 Unspecified dementia, severe, without behavioral disturbance, psychotic disturbance, mood disturbance, and anxiety: Secondary | ICD-10-CM | POA: Insufficient documentation

## 2010-12-29 DIAGNOSIS — I639 Cerebral infarction, unspecified: Secondary | ICD-10-CM | POA: Insufficient documentation

## 2010-12-29 DIAGNOSIS — R9430 Abnormal result of cardiovascular function study, unspecified: Secondary | ICD-10-CM | POA: Insufficient documentation

## 2010-12-30 ENCOUNTER — Ambulatory Visit (INDEPENDENT_AMBULATORY_CARE_PROVIDER_SITE_OTHER): Payer: Medicare Other | Admitting: Cardiology

## 2010-12-30 ENCOUNTER — Encounter: Payer: Self-pay | Admitting: Cardiology

## 2010-12-30 ENCOUNTER — Other Ambulatory Visit: Payer: Self-pay

## 2010-12-30 DIAGNOSIS — R0989 Other specified symptoms and signs involving the circulatory and respiratory systems: Secondary | ICD-10-CM

## 2010-12-30 DIAGNOSIS — F0390 Unspecified dementia without behavioral disturbance: Secondary | ICD-10-CM

## 2010-12-30 DIAGNOSIS — Z950 Presence of cardiac pacemaker: Secondary | ICD-10-CM

## 2010-12-30 DIAGNOSIS — I10 Essential (primary) hypertension: Secondary | ICD-10-CM

## 2010-12-30 DIAGNOSIS — T462X1A Poisoning by other antidysrhythmic drugs, accidental (unintentional), initial encounter: Secondary | ICD-10-CM

## 2010-12-30 DIAGNOSIS — I4891 Unspecified atrial fibrillation: Secondary | ICD-10-CM

## 2010-12-30 DIAGNOSIS — R9430 Abnormal result of cardiovascular function study, unspecified: Secondary | ICD-10-CM

## 2010-12-30 MED ORDER — METOPROLOL TARTRATE 50 MG PO TABS: 50.0000 mg | ORAL_TABLET | Freq: Two times a day (BID) | ORAL | Status: DC

## 2010-12-30 MED ORDER — LISINOPRIL 40 MG PO TABS: 20.0000 mg | ORAL_TABLET | Freq: Every day | ORAL | Status: DC

## 2010-12-30 MED ORDER — AMIODARONE HCL 200 MG PO TABS: 100.0000 mg | ORAL_TABLET | Freq: Every day | ORAL | Status: DC

## 2010-12-30 NOTE — Assessment & Plan Note (Signed)
We know that she has a good ejection fraction by echo.  She had some hypokinesis of the base of the inferior wall.  She had a nuclear scan that revealed no ischemia.  She's not having chest pain and she is not a candidate for any further workup of her coronary arteries

## 2010-12-30 NOTE — Assessment & Plan Note (Signed)
Blood pressure is controlled.  No change in therapy.  She is on metoprolol every 6 hours.  This will be changed to b.i.d.  We may change her back to the original atenolol that she was on.

## 2010-12-30 NOTE — Assessment & Plan Note (Signed)
The patient has underlying atrial fibrillation.  This was present while she was in the hospital.  I do not know how long she has had it.  When she was sick with her cholecystitis the rate was increased.  Amiodarone was added.  At this point it does not appear that amiodarone has helped her convert to sinus.  Certainly it is helping with rate control.  She has been on 600 mg a day for one month.  I had not yet decided we will continue with long-term but the dose will be definitely decreased.  She will change her dose to 100 mg daily.  She is not a Coumadin candidate.  There is no plan to try to cardiovert her.

## 2010-12-30 NOTE — Assessment & Plan Note (Signed)
The patient has now been on amiodarone 600 mg daily for greater than one month.  The dose today is to be decreased to 100 mg daily.  When I see her back we will decide about the approach to amiodarone long term.

## 2010-12-30 NOTE — Assessment & Plan Note (Signed)
Patient has significant dementia.  I have spoken privately with her daughter.  The dementia is marked.  Her daughter agrees that we should do everything to keep her feeling well.

## 2010-12-30 NOTE — Assessment & Plan Note (Signed)
The patient has a pacemaker.  We will obtain her data from her prior physician and we will transfer her pacemaker followup to our team.

## 2010-12-30 NOTE — Progress Notes (Signed)
HPI Patient is seen to establish new cardiology care. She was recently hospitalized.  Her family lives in [place] and she wants her cardiologist here.  She has a cardiologist in [facility] and we will be trying to obtain information.  The patient was hospitalized with acute cholecystitis.  During that hospitalization she had atrial fibrillation with a rapid response.  She was placed on amiodarone.  It appears that she has been on 600 mg a day since the day of her discharge on December 04, 2010.  She is any chest pain or shortness of breath.    As part of the evaluation today I have reviewed the hospital records including discharge summary, echo report, nuclear report.  Nuclear report. .No Known Allergies  Current Outpatient Prescriptions  Medication Sig Dispense Refill  . amiodarone (PACERONE) 200 MG tablet Take 200 mg by mouth every 8 (eight) hours as needed.        . digoxin (LANOXIN) 0.125 MG tablet Take 125 mcg by mouth daily.        Marland Kitchen dipyridamole-aspirin (AGGRENOX) 25-200 MG per 12 hr capsule Take 1 capsule by mouth daily.       Marland Kitchen doxycycline (VIBRA-TABS) 100 MG tablet 100 tablets Daily.      Marland Kitchen HYDROcodone-acetaminophen (NORCO) 5-325 MG per tablet Take 1 tablet by mouth every 6 (six) hours as needed.        Marland Kitchen lisinopril (PRINIVIL,ZESTRIL) 40 MG tablet Take 20 mg by mouth daily.       . metoprolol tartrate (LOPRESSOR) 25 MG tablet Take 25 mg by mouth every 6 (six) hours.        . docusate sodium (COLACE) 100 MG capsule Take 100 mg by mouth 2 (two) times daily.          History   Social History  . Marital Status: Widowed    Spouse Name: N/A    Number of Children: N/A  . Years of Education: N/A   Occupational History  . Not on file.   Social History Main Topics  . Smoking status: Former Games developer  . Smokeless tobacco: Not on file  . Alcohol Use: Yes     occasional  . Drug Use: Not on file  . Sexually Active: Not on file   Other Topics Concern  . Not on file   Social History  Narrative  . No narrative on file    Family History  Problem Relation Age of Onset  . Heart disease    . Hypertension      Past Medical History  Diagnosis Date  . Hypertension   . Dementia   . GI bleed   . Diverticulosis   . Cholecystitis     Laparoscopic cholecystectomy September 20 01  . Hematoma     Right flank hematoma / cellulitis  September, 2012  . Atrial fibrillation     Not a Coumadin candidate per the hospital record October, 2012,,, Chronic  /      September, 2012, rapid rate, controlled  . Pleural effusion     Right pleural effusion, thoracentesis, October, 2012, for therapeutic reasons  . Ejection fraction     EF 55-60%, echo, November 18, 2010, hypokinesis of the basal inferior wall  . CVA (cerebral infarction)     History of CVA  . Amiodarone     Amiodarone was started during hospitalization September, 2012  . Chest pain     Nuclear, hospital, September, 2012, fixed inferolateral defect and compatible with remote infarct, EF 66%,  no ischemia    Past Surgical History  Procedure Date  . Pacemaker insertion   . Hysterectomy - unknown type     ROS  The patient has some dementia. The questions are answered by her and her daughterWas in the room.  Patient denies fever, chills, headache, sweats, rash, change in vision, change in hearing, chest pain, cough, nausea vomiting, urinary symptoms.  All of the systems are reviewed and are negative. PHYSICAL EXAM Patient is stable.  She answers questions appropriately.  Head is atraumatic.  There is no jugular venous distention.  Lungs are clear.  Respiratory effort is nonlabored.  Cardiac exam reveals S1-S2.  The rhythm is regular.  The abdomen is soft.  There is no peripheral edema.  No musculoskeletal deformities.  No skin rashes. Filed Vitals:   12/30/10 1020  BP: 132/80  Pulse: 76  Height: 5\' 3"  (1.6 m)  Weight: 121 lb (54.885 kg)    EKG is done and reviewed by me.  The rhythm is paced and is regular at a rate  of 65.  However the baseline suggests the possibility of underlying atrial fibrillation or atrial flutter  ASSESSMENT & PLAN

## 2010-12-30 NOTE — Patient Instructions (Addendum)
Your physician recommends that you schedule a follow-up appointment in: 4 weeks with Dr Myrtis Ser Your physician has recommended you make the following change in your medication: DECREASE Amiodarone to 100 mg daily and CHANGE Metoprolol Tar to 50 mg twice daily You have been referred to our EP clinic for follow up of your pacemaker

## 2010-12-31 ENCOUNTER — Telehealth: Payer: Self-pay | Admitting: Cardiology

## 2010-12-31 NOTE — Telephone Encounter (Addendum)
ROI faxed to [facility] @ [phone]  12/31/10/km  Records received from [facility] gave to [facility] 01/01/11/km

## 2011-01-05 ENCOUNTER — Ambulatory Visit (INDEPENDENT_AMBULATORY_CARE_PROVIDER_SITE_OTHER): Payer: Medicare Other | Admitting: General Surgery

## 2011-01-05 ENCOUNTER — Encounter (INDEPENDENT_AMBULATORY_CARE_PROVIDER_SITE_OTHER): Payer: Self-pay | Admitting: General Surgery

## 2011-01-05 VITALS — BP 122/76 | HR 88 | Temp 96.9°F | Resp 20 | Ht 63.0 in | Wt 119.8 lb

## 2011-01-05 DIAGNOSIS — K812 Acute cholecystitis with chronic cholecystitis: Secondary | ICD-10-CM | POA: Insufficient documentation

## 2011-01-05 NOTE — Patient Instructions (Signed)
Call for questions 

## 2011-01-05 NOTE — Progress Notes (Addendum)
HISTORY: Pt doing well since discharge from hospital.  She underwent lap chole for gangrenous gallbladder 11/21/2010.  Her post op course was complicated by pleural effusion and flank cellulitis.  She has significantly improved since discharge.  She is ambulating with a cane.  She is much more awake.  She is having regular bowel movements.  She is not having fevers/ chills.  She finished antibiotics 2 weeks ago.     PERTINENT REVIEW OF SYSTEMS: Otherwise negative  EXAM: Head: Normocephalic and atraumatic.  Eyes:  Conjunctivae are normal. Pupils are equal, round, and reactive to light. No scleral icterus.  Neck:  Normal range of motion. Neck supple. No tracheal deviation present. No thyromegaly present.  Resp: No respiratory distress, normal effort. Abd:  Abdomen is soft, non distended and non tender. No masses are palpable.  There is no rebound and no guarding.  Neurological: Alert and oriented to person, place, and time. Coordination normal.  Skin: Skin is warm and dry. No rash noted. No diaphoretic. No erythema. No pallor.  Psychiatric: Normal mood and affect. Normal behavior. Judgment and thought content normal.    Pathology reviewed and demonstrates: Cholecystitis with necrosis  ASSESSMENT AND PLAN:   Cholecystitis chronic, acute Pt doing well. Abdominal wall cellulitis resolved. Follow up as needed.   Activity restrictions ended.  Activity as tolerated.         Maudry Diego, MD Surgical Oncology, General & Endocrine Surgery [facility]  Sid Falcon, MD No ref. provider found

## 2011-01-05 NOTE — Assessment & Plan Note (Signed)
Pt doing well. Abdominal wall cellulitis resolved. Follow up as needed.   Activity restrictions ended.  Activity as tolerated.

## 2011-01-24 ENCOUNTER — Other Ambulatory Visit: Payer: Self-pay | Admitting: Internal Medicine

## 2011-01-27 ENCOUNTER — Encounter: Payer: Self-pay | Admitting: Cardiology

## 2011-01-27 ENCOUNTER — Ambulatory Visit (INDEPENDENT_AMBULATORY_CARE_PROVIDER_SITE_OTHER): Payer: Medicare Other | Admitting: Cardiology

## 2011-01-27 DIAGNOSIS — I4891 Unspecified atrial fibrillation: Secondary | ICD-10-CM

## 2011-01-27 DIAGNOSIS — T462X1A Poisoning by other antidysrhythmic drugs, accidental (unintentional), initial encounter: Secondary | ICD-10-CM

## 2011-01-27 MED ORDER — LISINOPRIL 20 MG PO TABS: 20.0000 mg | ORAL_TABLET | Freq: Every day | ORAL | Status: DC

## 2011-01-27 NOTE — Assessment & Plan Note (Signed)
Amiodarone is to be stopped today.

## 2011-01-27 NOTE — Progress Notes (Signed)
HPI    Patient is here today to followup atrial fibrillation.  I had seen her as a new patient in room 6, 2012.  [place].  She had been ill in the hospital in [place] more recently.  With rapid atrial fibrillation, amiodarone was added and she was on a high dose when I saw her.  This was decreased to 100 mg daily.  She returns today.  She is gained some true body weight.  She looks better.  No Known Allergies  Current Outpatient Prescriptions  Medication Sig Dispense Refill  . amiodarone (PACERONE) 200 MG tablet Take 0.5 tablets (100 mg total) by mouth daily.  90 tablet  2  . digoxin (LANOXIN) 0.125 MG tablet Take 125 mcg by mouth daily.        Marland Kitchen dipyridamole-aspirin (AGGRENOX) 25-200 MG per 12 hr capsule Take 1 capsule by mouth daily.       Marland Kitchen docusate sodium (COLACE) 100 MG capsule Take 100 mg by mouth 2 (two) times daily as needed.       Marland Kitchen HYDROcodone-acetaminophen (NORCO) 5-325 MG per tablet Take 1 tablet by mouth every 6 (six) hours as needed.        Marland Kitchen lisinopril (PRINIVIL,ZESTRIL) 20 MG tablet Take 1 tablet (20 mg total) by mouth daily.  30 tablet  6  . metoprolol tartrate (LOPRESSOR) 50 MG tablet Take 1 tablet (50 mg total) by mouth 2 (two) times daily.  180 tablet  2  . DISCONTD: lisinopril (PRINIVIL,ZESTRIL) 20 MG tablet Take 20 mg by mouth daily.        Marland Kitchen DISCONTD: lisinopril (PRINIVIL,ZESTRIL) 40 MG tablet Take 0.5 tablets (20 mg total) by mouth daily.  15 tablet  6    History   Social History  . Marital Status: Widowed    Spouse Name: N/A    Number of Children: N/A  . Years of Education: N/A   Occupational History  . Not on file.   Social History Main Topics  . Smoking status: Former Games developer  . Smokeless tobacco: Not on file  . Alcohol Use: Yes     occasional  . Drug Use: No  . Sexually Active: Not on file   Other Topics Concern  . Not on file   Social History Narrative  . No narrative on file    Family History    Problem Relation Age of Onset  . Heart disease    . Hypertension    . Hypertension Mother   . Heart disease Mother     heart attack    Past Medical History  Diagnosis Date  . Hypertension   . Dementia   . GI bleed   . Diverticulosis   . Cholecystitis     Laparoscopic cholecystectomy September 20 01  . Hematoma     Right flank hematoma / cellulitis  September, 2012  . Atrial fibrillation     Not a Coumadin candidate per the hospital record October, 2012,,, Chronic  /      September, 2012, rapid rate, controlled  . Pleural effusion     Right pleural effusion, thoracentesis, October, 2012, for therapeutic reasons  . Ejection fraction     EF 55-60%, echo, November 18, 2010, hypokinesis of the basal inferior wall  . CVA (cerebral infarction)     History of CVA  . Amiodarone     Amiodarone was started during hospitalization September, 2012  . Chest pain  Nuclear, hospital, September, 2012, fixed inferolateral defect and compatible with remote infarct, EF 66%, no ischemia  . Pacemaker     Permanent pacemaker placed in the past.  Data to be obtained    Past Surgical History  Procedure Date  . Pacemaker insertion   . Hysterectomy - unknown type   . Cholecystectomy 11/21/10    ROS    The review of systems is obtained through the patient's daughter.  She denies fever, chills, headache, sweats, rash, change in vision, change in hearing, chest pain, cough, nausea vomiting, urinary symptoms.  All other systems are reviewed and are negative.  PHYSICAL EXAM   Patient is stable today.  There is no jugular venous extension.  Lungs are clear.  Respiratory effort is nonlabored.  Cardiac exam reveals S1-S2.  No clicks or significant murmurs.  The abdomen is soft.  There's no peripheral edema.  Filed Vitals:   01/27/11 1433  BP: 126/68  Pulse: 68  Height: 5\' 3"  (1.6 m)  Weight: 124 lb (56.246 kg)     ASSESSMENT & PLAN

## 2011-01-27 NOTE — Assessment & Plan Note (Signed)
The patient's atrial fibrillation is chronic.  Her rhythm today his regular because of her pacing.  We know from the last visit that her underlying rhythm was atrial fib.  She has no indication for ongoing amiodarone use.  It is stopped today.  She is not a Coumadin candidate.  The patient has an appointment to be seen for her pacemaker to be followed in our office.  I have received faxed papers relating to her prior care and her pacemaker.  I will be sure that these are delivered to our electrophysiologist.

## 2011-01-27 NOTE — Patient Instructions (Signed)
Your physician wants you to follow-up in:  6 months. You will receive a reminder letter in the mail two months in advance. If you don't receive a letter, please call our office to schedule the follow-up appointment.   

## 2011-02-10 ENCOUNTER — Encounter: Payer: Medicare Other | Admitting: Internal Medicine

## 2011-02-12 ENCOUNTER — Ambulatory Visit (INDEPENDENT_AMBULATORY_CARE_PROVIDER_SITE_OTHER): Payer: Medicare Other | Admitting: Internal Medicine

## 2011-02-12 ENCOUNTER — Encounter: Payer: Self-pay | Admitting: Internal Medicine

## 2011-02-12 DIAGNOSIS — I10 Essential (primary) hypertension: Secondary | ICD-10-CM

## 2011-02-12 DIAGNOSIS — I495 Sick sinus syndrome: Secondary | ICD-10-CM

## 2011-02-12 DIAGNOSIS — I4891 Unspecified atrial fibrillation: Secondary | ICD-10-CM

## 2011-02-12 LAB — PACEMAKER DEVICE OBSERVATION
BMOD-0001RV: LOW
BRDY-0002RV: 60 {beats}/min
BRDY-0004RV: 120 {beats}/min

## 2011-02-12 NOTE — Patient Instructions (Signed)
Your physician wants you to follow-up in: 12 months with Dr Jacquiline Doe will receive a reminder letter in the mail two months in advance. If you don't receive a letter, please call our office to schedule the follow-up appointment.   Remote monitoring is used to monitor your Pacemaker of ICD from home. This monitoring reduces the number of office visits required to check your device to one time per year. It allows [place] to keep an eye on the functioning of your device to ensure it is working properly. You are scheduled for a device check from home on 05/14/2010. You may send your transmission at any time that day. If you have a wireless device, the transmission will be sent automatically. After your physician reviews your transmission, you will receive a postcard with your next transmission date.

## 2011-02-12 NOTE — Assessment & Plan Note (Signed)
Normal pacemaker function See Pace Art report No changes today  

## 2011-02-12 NOTE — Progress Notes (Signed)
Brooke Falcon, MD: Primary Cardiologist:  Dr Wilhemena Durie is a 77 y.o. female with a h/o bradycardia sp PPM (MDT) who presents today to establish care in the Electrophysiology device clinic.  Her initial pacemaker was implanted in [place] 05/21/1994.  Her most recent generator change was also performed in [place] 07/23/05.  She moved to [place] 2 years ago to live with her daughter.  She has been followed previously with Cardiologists at [facility].  She subsequently was hospitalized at [facility] with GI bleeding and subsequent cholelithiasis.  She has moved her care to [facility] and now presents to establish care in the EP clinic today. The patient reports doing well since having a pacemaker implanted.  She has permanent afib.  V rates are mostly controlled.  She is not on coumadin due to GI bleeding (diverticular) which occurred earlier this year.  She has been treated with aggrenox once daily for stroke prevention.  She is mostly limited in her activities due to advanced age and dementia.  She occasionally goes shopping with her daughter but otherwise spends most of her time in the home.   Today, she  denies symptoms of palpitations, chest pain, shortness of breath, orthopnea, PND, lower extremity edema, dizziness, presyncope, syncope, or neurologic sequela.  The patientis tolerating medications without difficulties and is otherwise without complaint today.   Past Medical History  Diagnosis Date  . Hypertension   . Dementia   . GI bleed   . Diverticulosis   . Cholecystitis     Laparoscopic cholecystectomy September 20 01  . Hematoma     Right flank hematoma / cellulitis  September, 2012  . Atrial fibrillation     Not a Coumadin candidate per the hospital record October, 2012,,, Chronic  /      September, 2012, rapid rate, controlled  . Pleural effusion     Right pleural effusion, thoracentesis, October, 2012, for therapeutic reasons  . Ejection fraction     EF 55-60%,  echo, November 18, 2010, hypokinesis of the basal inferior wall  . CVA (cerebral infarction)     History of CVA  . Amiodarone     Amiodarone was started during hospitalization September, 2012  . Chest pain     Nuclear, hospital, September, 2012, fixed inferolateral defect and compatible with remote infarct, EF 66%, no ischemia  . Pacemaker     Permanent pacemaker placed in the past.  Data to be obtained  . Sinoatrial node dysfunction    Past Surgical History  Procedure Date  . Pacemaker insertion   . Hysterectomy - unknown type   . Cholecystectomy 11/21/10    History   Social History  . Marital Status: Widowed    Spouse Name: N/A    Number of Children: N/A  . Years of Education: N/A   Occupational History  . Not on file.   Social History Main Topics  . Smoking status: Former Games developer  . Smokeless tobacco: Not on file  . Alcohol Use: Yes     occasional  . Drug Use: No  . Sexually Active: Not on file   Other Topics Concern  . Not on file   Social History Narrative  . No narrative on file    Family History  Problem Relation Age of Onset  . Heart disease    . Hypertension    . Hypertension Mother   . Heart disease Mother     heart attack    No Known Allergies  Current Outpatient Prescriptions  Medication Sig Dispense Refill  . digoxin (LANOXIN) 0.125 MG tablet Take 125 mcg by mouth daily.        Marland Kitchen dipyridamole-aspirin (AGGRENOX) 25-200 MG per 12 hr capsule Take 1 capsule by mouth daily.       Marland Kitchen HYDROcodone-acetaminophen (NORCO) 5-325 MG per tablet Take 1 tablet by mouth every 6 (six) hours as needed.        Marland Kitchen lisinopril (PRINIVIL,ZESTRIL) 20 MG tablet Take 1 tablet (20 mg total) by mouth daily.  30 tablet  6  . metoprolol tartrate (LOPRESSOR) 50 MG tablet Take 1 tablet (50 mg total) by mouth 2 (two) times daily.  180 tablet  2    ROS- all systems are reviewed and negative except as per HPI  Physical Exam: Filed Vitals:   02/12/11 1228  BP: 138/72    Pulse: 71  Resp: 18  Height: 5\' 3"  (1.6 m)  Weight: 125 lb 6.4 oz (56.881 kg)    GEN- The patient is elderly appearing, alert     Head- normocephalic, atraumatic Eyes-  Sclera clear, conjunctiva pink Ears- hearing intact Oropharynx- clear Neck- supple, no JVP Lungs- Clear to ausculation bilaterally, normal work of breathing Chest- pacemaker pocket is well healed Heart- Regular rate and rhythm (paced),   GI- soft, NT, ND, + BS Extremities- no clubbing, cyanosis, or edema MS- age appropriate muscle atrophy Skin- no rash or lesion  Pacemaker interrogation- reviewed in detail today,  See PACEART report  Assessment and Plan:

## 2011-02-12 NOTE — Assessment & Plan Note (Signed)
She has rate controlled permanent afib Her CHADS 2 score is at least 4.  She should be anticoagulated with coumadin.  Given recent GI bleed and anemia, I have not started coumadin today.  IF she has no further difficulty with GI bleeding, I would recommend that Dr Myrtis Ser stop aggrenox and start coumadin in the future.  Given her advanced age and known GI bleeding, I would avoid pradaxa/ xarelto in this patient.  No changes today.

## 2011-02-12 NOTE — Assessment & Plan Note (Signed)
Stable No change required today  

## 2011-05-20 ENCOUNTER — Other Ambulatory Visit: Payer: Self-pay | Admitting: Internal Medicine

## 2011-06-02 ENCOUNTER — Encounter: Payer: Self-pay | Admitting: Internal Medicine

## 2011-08-11 ENCOUNTER — Encounter: Payer: Self-pay | Admitting: Internal Medicine

## 2011-10-24 ENCOUNTER — Other Ambulatory Visit: Payer: Self-pay | Admitting: Cardiology

## 2012-05-11 ENCOUNTER — Other Ambulatory Visit: Payer: Self-pay

## 2012-05-11 MED ORDER — METOPROLOL TARTRATE 50 MG PO TABS: 50.0000 mg | ORAL_TABLET | Freq: Two times a day (BID) | ORAL | Status: DC

## 2012-06-15 ENCOUNTER — Other Ambulatory Visit: Payer: Self-pay

## 2012-06-15 MED ORDER — METOPROLOL TARTRATE 50 MG PO TABS: 50.0000 mg | ORAL_TABLET | Freq: Two times a day (BID) | ORAL | Status: DC

## 2012-06-15 NOTE — Telephone Encounter (Signed)
Called and left non desriptive message for Brooke Griffin to call [facility]. She needs an appointment to receive further refills.

## 2012-07-14 ENCOUNTER — Other Ambulatory Visit: Payer: Self-pay | Admitting: *Deleted

## 2012-07-14 MED ORDER — METOPROLOL TARTRATE 50 MG PO TABS: 50.0000 mg | ORAL_TABLET | Freq: Two times a day (BID) | ORAL | Status: DC

## 2012-07-14 NOTE — Telephone Encounter (Signed)
No refills until appointment Fax Received. Refill Completed. Fatmata Legere Chowoe (R.M.A)   

## 2012-09-06 ENCOUNTER — Ambulatory Visit (INDEPENDENT_AMBULATORY_CARE_PROVIDER_SITE_OTHER): Payer: Medicare Other | Admitting: *Deleted

## 2012-09-06 ENCOUNTER — Encounter: Payer: Self-pay | Admitting: Cardiology

## 2012-09-06 ENCOUNTER — Ambulatory Visit (INDEPENDENT_AMBULATORY_CARE_PROVIDER_SITE_OTHER): Payer: Medicare Other | Admitting: Cardiology

## 2012-09-06 VITALS — BP 146/80 | HR 79 | Ht 63.0 in | Wt 152.0 lb

## 2012-09-06 DIAGNOSIS — I6350 Cerebral infarction due to unspecified occlusion or stenosis of unspecified cerebral artery: Secondary | ICD-10-CM

## 2012-09-06 DIAGNOSIS — I495 Sick sinus syndrome: Secondary | ICD-10-CM

## 2012-09-06 DIAGNOSIS — Z950 Presence of cardiac pacemaker: Secondary | ICD-10-CM

## 2012-09-06 DIAGNOSIS — I4891 Unspecified atrial fibrillation: Secondary | ICD-10-CM

## 2012-09-06 DIAGNOSIS — I639 Cerebral infarction, unspecified: Secondary | ICD-10-CM

## 2012-09-06 LAB — PACEMAKER DEVICE OBSERVATION
BATTERY VOLTAGE: 2.74 V
BMOD-0001RV: LOW
BMOD-0005RV: 95 {beats}/min
BRDY-0002RV: 60 {beats}/min
BRDY-0004RV: 120 {beats}/min
RV LEAD AMPLITUDE: 5.6 mv
VENTRICULAR PACING PM: 58

## 2012-09-06 NOTE — Assessment & Plan Note (Signed)
There is history of a CVA in she's been on Plavix. I've chosen not to change this.

## 2012-09-06 NOTE — Assessment & Plan Note (Signed)
The patient has had no recurring chest discomfort. No further workup.

## 2012-09-06 NOTE — Assessment & Plan Note (Signed)
Patient has chronic atrial fibrillation. Her rate is controlled. Her bleeding has been diverticular in the past. I'm has an in and this patient to use Coumadin. She did begin to rebleed at any time. I discussed this issue with her daughter and she understands. No change in therapy at this time.

## 2012-09-06 NOTE — Patient Instructions (Signed)
**Note De-identified  Obfuscation** Your physician recommends that you continue on your current medications as directed. Please refer to the Current Medication list given to you today.  Your physician wants you to follow-up in: 1 year. You will receive a reminder letter in the mail two months in advance. If you don't receive a letter, please call our office to schedule the follow-up appointment.  

## 2012-09-06 NOTE — Assessment & Plan Note (Addendum)
The patient has a permanent pacemaker. I will check with our electrophysiology team concerning when she should have any further followup.  As part of today's evaluation I spent greater than 25 minutes with her total care. More than half of this time was spent with direct contact with the patient and her daughter reviewing all issues and discussing Coumadin.

## 2012-09-06 NOTE — Progress Notes (Signed)
PPM check in office. 

## 2012-09-06 NOTE — Progress Notes (Signed)
HPI  Patient is seen to followup for history of atrial fibrillation. She had a pacemaker in the past and the followup is now here in our office. At one point she was on amiodarone but this was stopped. She has had a significant diverticular bleed in the past. Because of this it was felt that she was not a candidate for anticoagulation. Over time this issue has been reconsidered. She's also had a stroke in the past. Her neurologist has her on Plavix.  As part of today's evaluation I have reviewed my records from 2012 and updated the record. I've also reviewed the note from Dr. Johney Frame from December, 2012. At that time he raised the question of considering Coumadin again.    No Known Allergies  Current Outpatient Prescriptions  Medication Sig Dispense Refill  . clopidogrel (PLAVIX) 75 MG tablet Take 75 mg by mouth daily.      . digoxin (LANOXIN) 0.125 MG tablet Take 125 mcg by mouth daily.        Marland Kitchen losartan (COZAAR) 100 MG tablet Take 100 mg by mouth daily.      . metoprolol (LOPRESSOR) 50 MG tablet Take 1 tablet (50 mg total) by mouth 2 (two) times daily.  60 tablet  2   No current facility-administered medications for this visit.    History   Social History  . Marital Status: Widowed    Spouse Name: N/A    Number of Children: N/A  . Years of Education: N/A   Occupational History  . Not on file.   Social History Main Topics  . Smoking status: Former Games developer  . Smokeless tobacco: Not on file  . Alcohol Use: Yes     Comment: occasional  . Drug Use: No  . Sexually Active: Not on file   Other Topics Concern  . Not on file   Social History Narrative  . No narrative on file    Family History  Problem Relation Age of Onset  . Heart disease    . Hypertension    . Hypertension Mother   . Heart disease Mother     heart attack    Past Medical History  Diagnosis Date  . Hypertension   . Dementia   . GI bleed   . Diverticulosis   . Cholecystitis     Laparoscopic  cholecystectomy September 20 01  . Hematoma     Right flank hematoma / cellulitis  September, 2012  . Atrial fibrillation     Not a Coumadin candidate per the hospital record October, 2012,,, Chronic  /      September, 2012, rapid rate, controlled  . Pleural effusion     Right pleural effusion, thoracentesis, October, 2012, for therapeutic reasons  . Ejection fraction     EF 55-60%, echo, November 18, 2010, hypokinesis of the basal inferior wall  . CVA (cerebral infarction)     History of CVA  . Amiodarone     Amiodarone was started during hospitalization September, 2012  . Chest pain     Nuclear, hospital, September, 2012, fixed inferolateral defect and compatible with remote infarct, EF 66%, no ischemia  . Pacemaker     Permanent pacemaker placed in the past.  Data to be obtained  . Sinoatrial node dysfunction     Past Surgical History  Procedure Laterality Date  . Pacemaker insertion    . Hysterectomy - unknown type    . Cholecystectomy  11/21/10    Patient Active Problem List  Diagnosis Date Noted  . Tachycardia-bradycardia   . Cholecystitis chronic, acute 01/05/2011  . Pacemaker   . Dementia   . Hematoma   . Hypertension   . Atrial fibrillation   . Pleural effusion   . Ejection fraction   . CVA (cerebral infarction)   . Amiodarone   . Chest pain     ROS   Patient denies fever, chills, headache, sweats, rash, change in vision, change in hearing, chest pain, cough, nausea vomiting, urinary symptoms. All other systems are reviewed and are negative.  PHYSICAL EXAM  Patient's daughter is here and answers most of the questions for her. There is no jugulovenous distention. Lungs are clear. Respiratory effort is nonlabored. Cardiac exam reveals S1 and S2. The rhythm is regular today. The abdomen is soft. There is no peripheral edema.  Filed Vitals:   09/06/12 1213  BP: 146/80  Pulse: 79  Height: 5\' 3"  (1.6 m)  Weight: 152 lb (68.947 kg)    EKG is done today and  reviewed by me. The rhythm is ventricularly paced.  ASSESSMENT & PLAN

## 2012-09-09 ENCOUNTER — Encounter: Payer: Self-pay | Admitting: Internal Medicine

## 2012-09-27 ENCOUNTER — Other Ambulatory Visit: Payer: Self-pay | Admitting: *Deleted

## 2012-09-27 MED ORDER — DIGOXIN 125 MCG PO TABS: 125.0000 ug | ORAL_TABLET | Freq: Every day | ORAL | Status: DC

## 2012-09-27 MED ORDER — CLOPIDOGREL BISULFATE 75 MG PO TABS: 75.0000 mg | ORAL_TABLET | Freq: Every day | ORAL | Status: DC

## 2012-10-19 ENCOUNTER — Other Ambulatory Visit: Payer: Self-pay | Admitting: Cardiology

## 2012-11-22 ENCOUNTER — Other Ambulatory Visit: Payer: Self-pay | Admitting: Cardiology

## 2013-03-15 ENCOUNTER — Encounter: Payer: Self-pay | Admitting: *Deleted

## 2013-03-29 ENCOUNTER — Encounter: Payer: Self-pay | Admitting: *Deleted

## 2013-04-05 ENCOUNTER — Encounter: Payer: Self-pay | Admitting: Internal Medicine

## 2013-04-14 ENCOUNTER — Encounter: Payer: Medicare Other | Admitting: Internal Medicine

## 2013-05-24 ENCOUNTER — Encounter: Payer: Medicare Other | Admitting: Internal Medicine

## 2013-05-31 ENCOUNTER — Encounter (HOSPITAL_COMMUNITY): Payer: Self-pay | Admitting: Emergency Medicine

## 2013-05-31 DIAGNOSIS — Z950 Presence of cardiac pacemaker: Secondary | ICD-10-CM | POA: Insufficient documentation

## 2013-05-31 DIAGNOSIS — Z7902 Long term (current) use of antithrombotics/antiplatelets: Secondary | ICD-10-CM | POA: Insufficient documentation

## 2013-05-31 DIAGNOSIS — Z8719 Personal history of other diseases of the digestive system: Secondary | ICD-10-CM | POA: Insufficient documentation

## 2013-05-31 DIAGNOSIS — Z79899 Other long term (current) drug therapy: Secondary | ICD-10-CM | POA: Insufficient documentation

## 2013-05-31 DIAGNOSIS — Y939 Activity, unspecified: Secondary | ICD-10-CM | POA: Insufficient documentation

## 2013-05-31 DIAGNOSIS — Y929 Unspecified place or not applicable: Secondary | ICD-10-CM | POA: Insufficient documentation

## 2013-05-31 DIAGNOSIS — F0390 Unspecified dementia without behavioral disturbance: Secondary | ICD-10-CM | POA: Insufficient documentation

## 2013-05-31 DIAGNOSIS — I10 Essential (primary) hypertension: Secondary | ICD-10-CM | POA: Insufficient documentation

## 2013-05-31 DIAGNOSIS — I4891 Unspecified atrial fibrillation: Secondary | ICD-10-CM | POA: Insufficient documentation

## 2013-05-31 DIAGNOSIS — S32009A Unspecified fracture of unspecified lumbar vertebra, initial encounter for closed fracture: Secondary | ICD-10-CM | POA: Insufficient documentation

## 2013-05-31 DIAGNOSIS — S22009A Unspecified fracture of unspecified thoracic vertebra, initial encounter for closed fracture: Secondary | ICD-10-CM | POA: Insufficient documentation

## 2013-05-31 DIAGNOSIS — Z87891 Personal history of nicotine dependence: Secondary | ICD-10-CM | POA: Insufficient documentation

## 2013-05-31 DIAGNOSIS — Z8673 Personal history of transient ischemic attack (TIA), and cerebral infarction without residual deficits: Secondary | ICD-10-CM | POA: Insufficient documentation

## 2013-05-31 DIAGNOSIS — Z8709 Personal history of other diseases of the respiratory system: Secondary | ICD-10-CM | POA: Insufficient documentation

## 2013-05-31 DIAGNOSIS — W19XXXA Unspecified fall, initial encounter: Secondary | ICD-10-CM | POA: Insufficient documentation

## 2013-05-31 LAB — COMPREHENSIVE METABOLIC PANEL
ALBUMIN: 3.6 g/dL (ref 3.5–5.2)
ALK PHOS: 82 U/L (ref 39–117)
ALT: 14 U/L (ref 0–35)
AST: 27 U/L (ref 0–37)
BUN: 31 mg/dL — ABNORMAL HIGH (ref 6–23)
CO2: 18 mEq/L — ABNORMAL LOW (ref 19–32)
Calcium: 9.6 mg/dL (ref 8.4–10.5)
Chloride: 105 mEq/L (ref 96–112)
Creatinine, Ser: 1.2 mg/dL — ABNORMAL HIGH (ref 0.50–1.10)
GFR calc Af Amer: 46 mL/min — ABNORMAL LOW (ref >90)
GFR calc non Af Amer: 40 mL/min — ABNORMAL LOW (ref >90)
GLUCOSE: 133 mg/dL — AB (ref 70–99)
POTASSIUM: 4.2 meq/L (ref 3.7–5.3)
SODIUM: 144 meq/L (ref 137–147)
TOTAL PROTEIN: 7.2 g/dL (ref 6.0–8.3)
Total Bilirubin: 1.2 mg/dL (ref 0.3–1.2)

## 2013-05-31 LAB — CBC WITH DIFFERENTIAL/PLATELET
BASOS ABS: 0 10*3/uL (ref 0.0–0.1)
BASOS PCT: 0 % (ref 0–1)
EOS ABS: 0 10*3/uL (ref 0.0–0.7)
Eosinophils Relative: 0 % (ref 0–5)
HCT: 44.8 % (ref 36.0–46.0)
Hemoglobin: 15 g/dL (ref 12.0–15.0)
LYMPHS ABS: 1.9 10*3/uL (ref 0.7–4.0)
Lymphocytes Relative: 17 % (ref 12–46)
MCH: 29.7 pg (ref 26.0–34.0)
MCHC: 33.5 g/dL (ref 30.0–36.0)
MCV: 88.7 fL (ref 78.0–100.0)
Monocytes Absolute: 0.9 10*3/uL (ref 0.1–1.0)
Monocytes Relative: 8 % (ref 3–12)
NEUTROS PCT: 75 % (ref 43–77)
Neutro Abs: 8.3 10*3/uL — ABNORMAL HIGH (ref 1.7–7.7)
PLATELETS: 217 10*3/uL (ref 150–400)
RBC: 5.05 MIL/uL (ref 3.87–5.11)
RDW: 14.6 % (ref 11.5–15.5)
WBC: 11.1 10*3/uL — ABNORMAL HIGH (ref 4.0–10.5)

## 2013-05-31 LAB — LIPASE, BLOOD: Lipase: 29 U/L (ref 11–59)

## 2013-05-31 NOTE — ED Notes (Signed)
Pt fell last and since she has has lower back pain since, and has not been eating. Also states bilateral abdominal pain. Denies nausea or vomiting. Hx: stroke on right side. Pt has cane but normally walks by holding onto the wall.

## 2013-06-01 ENCOUNTER — Emergency Department (HOSPITAL_COMMUNITY): Payer: Medicare Other

## 2013-06-01 ENCOUNTER — Emergency Department (HOSPITAL_COMMUNITY)
Admission: EM | Admit: 2013-06-01 | Discharge: 2013-06-01 | Disposition: A | Discharge status: Home / Self Care | Payer: Medicare Other | Attending: Emergency Medicine | Admitting: Emergency Medicine

## 2013-06-01 DIAGNOSIS — S22070A Wedge compression fracture of T9-T10 vertebra, initial encounter for closed fracture: Secondary | ICD-10-CM

## 2013-06-01 DIAGNOSIS — S32020A Wedge compression fracture of second lumbar vertebra, initial encounter for closed fracture: Secondary | ICD-10-CM

## 2013-06-01 MED ORDER — HYDROCODONE-ACETAMINOPHEN 7.5-325 MG/15ML PO SOLN: 5.0000 mL | Freq: Four times a day (QID) | ORAL | Status: DC | PRN

## 2013-06-01 MED ORDER — SODIUM CHLORIDE 0.9 % IV SOLN
INTRAVENOUS | Status: DC
Start: 2013-06-01 — End: 2013-06-01
  Administered 2013-06-01: 03:00:00 via INTRAVENOUS

## 2013-06-01 MED ORDER — FENTANYL CITRATE 0.05 MG/ML IJ SOLN
12.5000 ug | INTRAMUSCULAR | Status: DC | PRN
  Administered 2013-06-01: 12.5 ug via INTRAVENOUS
  Filled 2013-06-01: qty 2

## 2013-06-01 NOTE — ED Notes (Signed)
Patient has left to go to CT.

## 2013-06-01 NOTE — Discharge Instructions (Signed)
Back, Compression Fracture °A compression fracture happens when a force is put upon the length of your spine. Slipping and falling on your bottom are examples of such a force. When this happens, sometimes the force is great enough to compress the building blocks (vertebral bodies) of your spine. Although this causes a lot of pain, this can usually be treated at home, unless your caregiver feels hospitalization is needed for pain control. °Your backbone (spinal column) is made up of 24 main vertebral bodies in addition to the sacrum and coccyx (see illustration). These are held together by tough fibrous tissues (ligaments) and by support of your muscles. Nerve roots pass through the openings between the vertebrae. A sudden wrenching move, injury, or a fall may cause a compression fracture of one of the vertebral bodies. This may result in back pain or spread of pain into the belly (abdomen), the buttocks, and down the leg into the foot. Pain may also be created by muscle spasm alone. °Large studies have been undertaken to determine the best possible course of action to help your back following injury and also to prevent future problems. The recommendations are as follows. °FOLLOWING A COMPRESSION FRACTURE: °Do the following only if advised by your caregiver.  °· If a back brace has been suggested or provided, wear it as directed. °· DO NOT stop wearing the back brace unless instructed by your caregiver. °· When allowed to return to regular activities, avoid a sedentary life style. Actively exercise. Sporadic weekend binges of tennis, racquetball, water skiing, may actually aggravate or create problems, especially if you are not in condition for that activity. °· Avoid sports requiring sudden body movements until you are in condition for them. Swimming and walking are safer activities. °· Maintain good posture. °· Avoid obesity. °· If not already done, you should have a DEXA scan. Based on the results, be treated for  osteoporosis. °FOLLOWING ACUTE (SUDDEN) INJURY: °· Only take over-the-counter or prescription medicines for pain, discomfort, or fever as directed by your caregiver. °· Use bed rest for only the most extreme acute episode. Prolonged bed rest may aggravate your condition. Ice used for acute conditions is effective. Use a large plastic bag filled with ice. Wrap it in a towel. This also provides excellent pain relief. This may be continuous. Or use it for 30 minutes every 2 hours during acute phase, then as needed. Heat for 30 minutes prior to activities is helpful. °· As soon as the acute phase (the time when your back is too painful for you to do normal activities) is over, it is important to resume normal activities and work hardening programs. Back injuries can cause potentially marked changes in lifestyle. So it is important to attack these problems aggressively. °· See your caregiver for continued problems. He or she can help or refer you for appropriate exercises, physical therapy and work hardening if needed. °· If you are given narcotic medications for your condition, for the next 24 hours DO NOT: °· Drive °· Operate machinery or power tools. °· Sign legal documents. °· DO NOT drink alcohol, take sleeping pills or other medications that may interfere with treatment. °If your caregiver has given you a follow-up appointment, it is very important to keep that appointment. Not keeping the appointment could result in a chronic or permanent injury, pain, and disability. If there is any problem keeping the appointment, you must call back to this facility for assistance.  °SEEK IMMEDIATE MEDICAL CARE IF: °· You develop numbness,   tingling, weakness, or problems with the use of your arms or legs. °· You develop severe back pain not relieved with medications. °· You have changes in bowel or bladder control. °· You have increasing pain in any areas of the body. °Document Released: 02/09/2005 Document Revised: 05/04/2011  Document Reviewed: 09/14/2007 °[employer]® Patient Information ©2014 [employer]. ° °

## 2013-06-01 NOTE — ED Provider Notes (Signed)
CSN: [id number]     Arrival date & time 05/31/13  1948 History   First MD Initiated Contact with Patient 06/01/13 0240     Chief Complaint  Patient presents with  . Fall     (Consider location/radiation/quality/duration/timing/severity/associated sxs/prior Treatment) Patient is a 80 y.o. female presenting with fall.  Fall   History provided by patient and her daughter bedside. Patient has history of dementia, previous stroke with right-sided deficits but still able to ambulate on her own. She fell about 10 days ago and ever since that time is complaining of lower back pain. She has known spinal stenosis. Pain is worse with movement and sitting up. Patient denies any pain currently. Daughter states that with her dementia she is not a reliable historian. She has also had decreased appetite over the last few days. No fevers. No difficulty urinating. Normal bowel movement this morning. Last urination this morning. Currently trying to use the bedpan and unable to go. Daughter states when she comes to the emergency room typically requires in and out catheterization. Patient does not provide any reliable history and level V caveat applies  Past Medical History  Diagnosis Date  . Hypertension   . Dementia   . GI bleed   . Diverticulosis   . Cholecystitis     Laparoscopic cholecystectomy September 20 01  . Hematoma     Right flank hematoma / cellulitis  September, 2012  . Atrial fibrillation     Not a Coumadin candidate per the hospital record October, 2012,,, Chronic  /      September, 2012, rapid rate, controlled  . Pleural effusion     Right pleural effusion, thoracentesis, October, 2012, for therapeutic reasons  . Ejection fraction     EF 55-60%, echo, November 18, 2010, hypokinesis of the basal inferior wall  . CVA (cerebral infarction)     History of CVA  . Amiodarone     Amiodarone was started during hospitalization September, 2012  . Chest pain     Nuclear, hospital, September,  2012, fixed inferolateral defect and compatible with remote infarct, EF 66%, no ischemia  . Pacemaker     Permanent pacemaker placed in the past.  Data to be obtained  . Sinoatrial node dysfunction    Past Surgical History  Procedure Laterality Date  . Pacemaker insertion    . Hysterectomy - unknown type    . Cholecystectomy  11/21/10   Family History  Problem Relation Age of Onset  . Heart disease    . Hypertension    . Hypertension Mother   . Heart disease Mother     heart attack   History  Substance Use Topics  . Smoking status: Former Research scientist (life sciences)  . Smokeless tobacco: Not on file  . Alcohol Use: Yes     Comment: occasional   OB History   Grav Para Term Preterm Abortions TAB SAB Ect Mult Living                 Review of Systems  Unable to perform ROS  level V caveat for dementia    Allergies  Review of patient's allergies indicates no known allergies.  Home Medications   Current Outpatient Rx  Name  Route  Sig  Dispense  Refill  . clopidogrel (PLAVIX) 75 MG tablet   Oral   Take 1 tablet (75 mg total) by mouth daily.   90 tablet   3     Patient no longer goes to [facility]. See ...   Marland Kitchen  digoxin (LANOXIN) 0.125 MG tablet   Oral   Take 1 tablet (125 mcg total) by mouth daily.   90 tablet   3     Patient no longer goes to [facility]. See ...   . losartan (COZAAR) 100 MG tablet   Oral   Take 100 mg by mouth daily.         . metoprolol (LOPRESSOR) 50 MG tablet      TAKE ONE TABLET BY MOUTH TWICE DAILY   60 tablet   5    BP 154/90  Pulse 88  Temp(Src) 98 F (36.7 C) (Oral)  Resp 16  Ht 5\' 3"  (1.6 m)  Wt 152 lb (68.947 kg)  BMI 26.93 kg/m2  SpO2 96% Physical Exam  Constitutional: She appears well-developed and well-nourished.  HENT:  Head: Normocephalic and atraumatic.  Eyes: EOM are normal. Pupils are equal, round, and reactive to light.  Neck: Neck supple.  Cardiovascular: Normal rate, regular rhythm and intact distal  pulses.   Pulmonary/Chest: Effort normal and breath sounds normal. No respiratory distress.  Abdominal: Soft. Bowel sounds are normal. There is no tenderness.  Fullness over the bladder without significant tenderness  Musculoskeletal: Normal range of motion. She exhibits no edema.  Some tenderness over the lower thoracic and lumbar spine and also some right para lumbar tenderness. No obvious deformity. Baseline right lower extremity deficits with history of stroke but distal neurovascular intact right lower extremity and left lower extremity.  Neurological: She is alert.  Skin: Skin is warm and dry.    ED Course  Procedures (including critical care time) Labs Review Labs Reviewed  CBC WITH DIFFERENTIAL - Abnormal; Notable for the following:    WBC 11.1 (*)    Neutro Abs 8.3 (*)    All other components within normal limits  COMPREHENSIVE METABOLIC PANEL - Abnormal; Notable for the following:    CO2 18 (*)    Glucose, Bld 133 (*)    BUN 31 (*)    Creatinine, Ser 1.20 (*)    GFR calc non Af Amer 40 (*)    GFR calc Af Amer 46 (*)    All other components within normal limits  LIPASE, BLOOD   Imaging Review Dg Thoracic Spine 2 View  06/01/2013   CLINICAL DATA:  Fall.  EXAM: THORACIC SPINE - 2 VIEW  COMPARISON:  None.  FINDINGS: Remote T12-L1 moderate compression fractures. Possible mild T10 compression fracture with less than 25% height loss. Evaluation limited by severe osteopenia. No malalignment. Intervertebral disc heights generally preserved. No destructive bony lesions. Cardiac pacer lead in place.  IMPRESSION: Possible mild T10 compression fracture may be acute, with remote T12 and L1 compression fractures. No malalignment.  Severe osteopenia.   Electronically Signed   By: Elon Alas   On: 06/01/2013 03:21   Dg Lumbar Spine Complete  06/01/2013   CLINICAL DATA:  FALL  EXAM: LUMBAR SPINE - COMPLETE 4+ VIEW  COMPARISON:  CT ABD/PELVIS W CM dated 11/28/2010  FINDINGS: Mild L2  superior endplate compression fracture may be acute, with less than 25% height loss. Moderate chronic T12 and L1 compression fractures.  Straightened lumbar lordosis. No malalignment. Severe L3-4 and L4-5 degenerative disc disease, similar. Moderate L2-3 and L5-S1 degenerative disc disease.  Limited assessment of pars interarticularis defects due to the incomplete obliquity though, none is suspected.  Lower lumbar levoscoliosis as previously seen. Bone mineral density is decreased without destructive bony lesions. Aortoiliac severe vascular calcifications. Surgical clips in the  right abdomen likely reflect cholecystectomy.  IMPRESSION: Mild L2 compression fracture may be acute, with remote moderate T12 and L1 compression fractures. Osteopenia.  No malalignment.  Similar lumbar spondylosis.   Electronically Signed   By: Elon Alas   On: 06/01/2013 03:19   Ct Head Wo Contrast  06/01/2013   CLINICAL DATA:  FALL  EXAM: CT HEAD WITHOUT CONTRAST  TECHNIQUE: Contiguous axial images were obtained from the base of the skull through the vertex without intravenous contrast.  COMPARISON:  CT of the head September 02, 2011  FINDINGS: Moderate to severe ventriculomegaly, likely on the basis of global parenchymal brain volume loss as there is overall commensurate enlargement of cerebral sulci and cerebellar folia. No intraparenchymal hemorrhage, mass effect nor midline shift. Severe supratentorial white matter hypodensities in addition to remote left basal ganglia cystic lacunar infarct ; mild ex vacuo dilatation of the subjacent left ventricle. Remote left superior cerebellar infarct. Remote right frontal and left parietal cortical to subcortical encephalomalacia. No acute large vascular territory infarcts.  No abnormal extra-axial fluid collections. Basal cisterns are patent. Moderate calcific atherosclerosis of the carotid siphons.  No skull fracture. Visualized paranasal sinuses and mastoid aircells are well-aerated. The  included ocular globes and orbital contents are non-suspicious. Patient is nearly edentulous.  IMPRESSION: No acute intracranial process.  Stable appearance of the head: Moderate to severe global brain atrophy. Severe white matter changes likely reflect chronic small vessel ischemic disease in addition to remote left basal ganglia and left cerebellar infarcts. Right frontal and left parietal encephalomalacia may reflect remote traumatic or ischemic insult.   Electronically Signed   By: Elon Alas   On: 06/01/2013 03:35   IV fentanyl pain control In and out cath approximately 120 cc of clear urine output By mouth fluids provided. Ambulates no acute distress - baseline per daughter  5:23 AM imaging results shared with patient and her daughter bedside. Pain improved with medications. Daughter has an orthopedic surgeon in [place] that she would prefer to followup with. She also agrees to) care followup. Daughter requesting that I prescribed her mother liquid pain medication, has had hydrocodone in the past.  Plan discharge home with prescription for hycet - will take as low dose narcotic as needed. Return precautions provided verbalized as understood by patient's daughter.  MDM   Diagnosis: L2 compression fracture, T10 compression fracture  Presents with persistent back pain since fall about 10 days ago. Has baseline right-sided deficits or previous stroke which are unchanged. Has history of dementia and decreased appetite over the last few days and a CT brain was obtained and reviewed as above. Imaging of thoracic and lumbar spine demonstrate L2 and T10 compression fractures not seen on previous imaging. Pain control IV narcotics. Condition improved. Vital signs and nurses notes reviewed and considered.    Teressa Lower, MD 06/01/13 [phone]

## 2013-06-01 NOTE — ED Notes (Signed)
Dr. Opitz at the bedside.  

## 2013-06-01 NOTE — ED Notes (Signed)
Patient is back from CT.

## 2013-06-01 NOTE — ED Notes (Signed)
Pt ambulated in hallway x 2 assist. Pt c/o pain during walk and right foot was slightly sluggish during ambulation.

## 2013-06-01 NOTE — ED Notes (Signed)
Dr. Marnette Burgess at the bedside to update family on plan of care.

## 2013-06-15 ENCOUNTER — Other Ambulatory Visit: Payer: Self-pay | Admitting: Cardiology

## 2013-06-16 ENCOUNTER — Encounter: Payer: Self-pay | Admitting: Internal Medicine

## 2013-06-16 ENCOUNTER — Ambulatory Visit (INDEPENDENT_AMBULATORY_CARE_PROVIDER_SITE_OTHER): Payer: Medicare Other | Admitting: Internal Medicine

## 2013-06-16 VITALS — BP 119/73 | HR 86 | Ht 63.0 in | Wt 147.0 lb

## 2013-06-16 DIAGNOSIS — I4891 Unspecified atrial fibrillation: Secondary | ICD-10-CM

## 2013-06-16 DIAGNOSIS — Z950 Presence of cardiac pacemaker: Secondary | ICD-10-CM

## 2013-06-16 DIAGNOSIS — I495 Sick sinus syndrome: Secondary | ICD-10-CM

## 2013-06-16 LAB — MDC_IDC_ENUM_SESS_TYPE_INCLINIC
Battery Impedance: 3148 Ohm
Date Time Interrogation Session: 20150424171617
Lead Channel Impedance Value: 0 Ohm
Lead Channel Impedance Value: 658 Ohm
Lead Channel Pacing Threshold Amplitude: 0.5 V
Lead Channel Pacing Threshold Pulse Width: 0.4 ms
Lead Channel Sensing Intrinsic Amplitude: 5.6 mV
Lead Channel Setting Sensing Sensitivity: 2 mV
MDC IDC MSMT BATTERY REMAINING LONGEVITY: 19 mo
MDC IDC MSMT BATTERY VOLTAGE: 2.71 V
MDC IDC SET LEADCHNL RV PACING AMPLITUDE: 2.5 V
MDC IDC SET LEADCHNL RV PACING PULSEWIDTH: 0.4 ms
MDC IDC STAT BRADY RV PERCENT PACED: 50 %

## 2013-06-16 NOTE — Patient Instructions (Signed)
Your physician recommends that you continue on your current medications as directed. Please refer to the Current Medication list given to you today.  Your physician wants you to follow-up in: 1 year with [employer], PA-C.  You will receive a reminder letter in the mail two months in advance. If you don't receive a letter, please call our office to schedule the follow-up appointment.  Remote monitoring is used to monitor your Pacemaker of ICD from home. This monitoring reduces the number of office visits required to check your device to one time per year. It allows [place] to keep an eye on the functioning of your device to ensure it is working properly. You are scheduled for a device check from home on 09/18/13. You may send your transmission at any time that day. If you have a wireless device, the transmission will be sent automatically. After your physician reviews your transmission, you will receive a postcard with your next transmission date.

## 2013-06-18 NOTE — Progress Notes (Signed)
PCP: Wonda Cerise, MD Primary Cardiologist:  Dr Rayford Halsted is a 80 y.o. female who presents today for routine electrophysiology followup.  Since last being seen in our clinic, the patient reports doing reasonably well.  Her primary concern is with arthritis issues.  Today, she denies symptoms of palpitations, chest pain, shortness of breath, lower extremity edema, dizziness, presyncope, or syncope.  The patient is otherwise without complaint today.   Past Medical History  Diagnosis Date  . Hypertension   . Dementia   . GI bleed   . Diverticulosis   . Cholecystitis     Laparoscopic cholecystectomy September 20 01  . Hematoma     Right flank hematoma / cellulitis  September, 2012  . Atrial fibrillation     Not a Coumadin candidate per the hospital record October, 2012,,, Chronic  /      September, 2012, rapid rate, controlled  . Pleural effusion     Right pleural effusion, thoracentesis, October, 2012, for therapeutic reasons  . Ejection fraction     EF 55-60%, echo, November 18, 2010, hypokinesis of the basal inferior wall  . CVA (cerebral infarction)     History of CVA  . Amiodarone     Amiodarone was started during hospitalization September, 2012  . Chest pain     Nuclear, hospital, September, 2012, fixed inferolateral defect and compatible with remote infarct, EF 66%, no ischemia  . Pacemaker     Permanent pacemaker placed in the past.  Data to be obtained  . Sinoatrial node dysfunction    Past Surgical History  Procedure Laterality Date  . Pacemaker insertion    . Hysterectomy - unknown type    . Cholecystectomy  11/21/10    Current Outpatient Prescriptions  Medication Sig Dispense Refill  . clopidogrel (PLAVIX) 75 MG tablet Take 1 tablet (75 mg total) by mouth daily.  90 tablet  3  . digoxin (LANOXIN) 0.125 MG tablet Take 1 tablet (125 mcg total) by mouth daily.  90 tablet  3  . HYDROcodone-acetaminophen (HYCET) 7.5-325 mg/15 ml solution Take 5 mLs by  mouth every 6 (six) hours as needed for moderate pain.  60 mL  0  . losartan (COZAAR) 100 MG tablet Take 100 mg by mouth daily.      . metoprolol (LOPRESSOR) 50 MG tablet TAKE ONE TABLET BY MOUTH TWICE DAILY  60 tablet  2   No current facility-administered medications for this visit.    Physical Exam: Filed Vitals:   06/16/13 1529  BP: 119/73  Pulse: 86  Height: 5\' 3"  (1.6 m)  Weight: 147 lb (66.679 kg)    GEN- The patient is elderly appearing, alert and oriented x 3 today.   Head- normocephalic, atraumatic Eyes-  Sclera clear, conjunctiva pink Ears- hearing intact Oropharynx- clear Lungs- Clear to ausculation bilaterally, normal work of breathing Chest- pacemaker pocket is well healed Heart- Regular rate and rhythm, no murmurs, rubs or gallops, PMI not laterally displaced GI- soft, NT, ND, + BS Extremities- no clubbing, cyanosis, or edema  Pacemaker interrogation- reviewed in detail today,  See PACEART report  Assessment and Plan:  1. tachycardia/ bradycardia Normal pacemaker function See Pace Art report No changes today  2. Permanent atrial fibrillation Rate controlled chads2vasc score is at least 6.  She is on plavix presently.  AVERROES data would support eliquis as a good alternative in patients felt to be too high risk for coumadin.  I think that stopping plavix and starting eliquis  might be a good idea.  I will defer this decision to Dr Ron Parker who knows the patient well.  The patient is quite reluctant to consider any changes at this time.  3. htn Stable No change required today  carelink Return to see Jerene Pitch in 1 year Follow-up with Dr Ron Parker as scheduled

## 2013-09-17 IMAGING — CR DG CHEST 2V
2 series · 2 of 2 positions shown · non-contrast
Comparison: None.

CLINICAL DATA: Cough.  Chest congestion.  Smoker.

CHEST - 2 VIEW 11/15/2010:

[w chest pa]
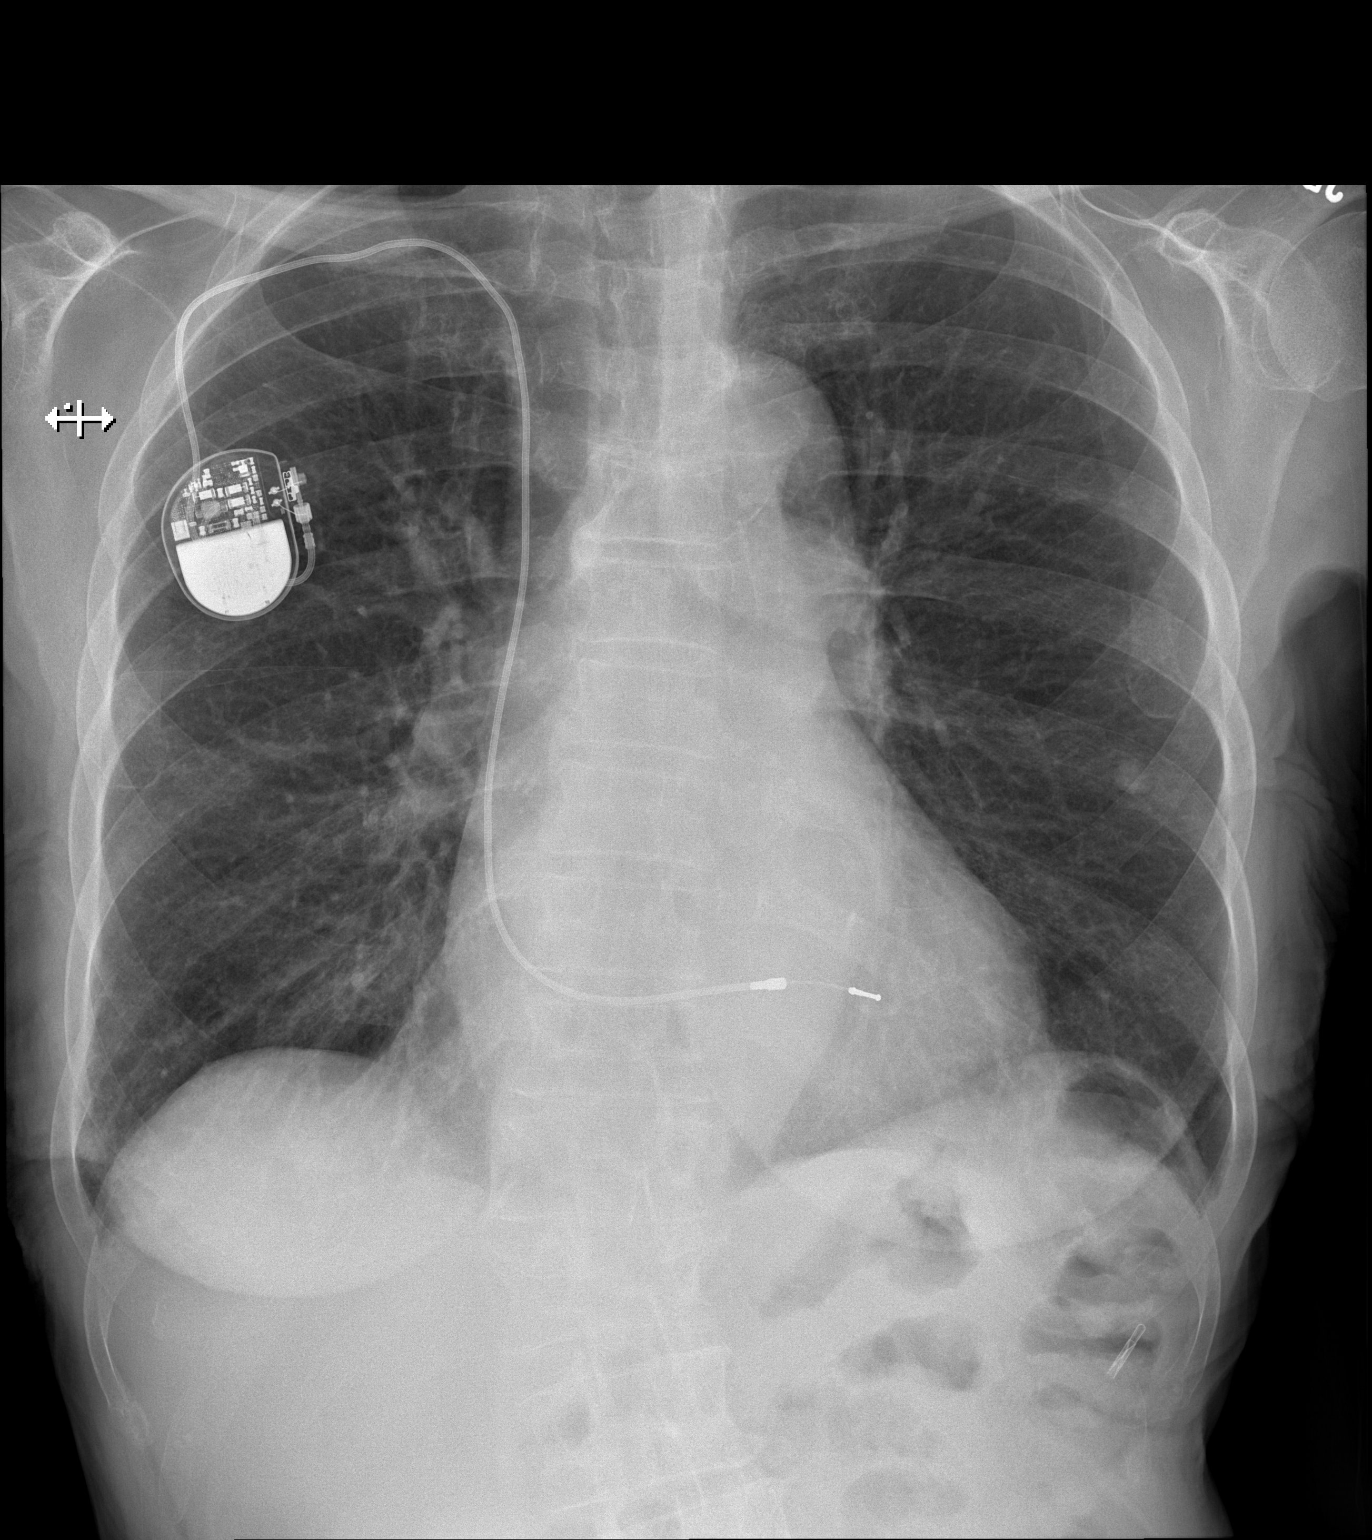

[w chest lat]
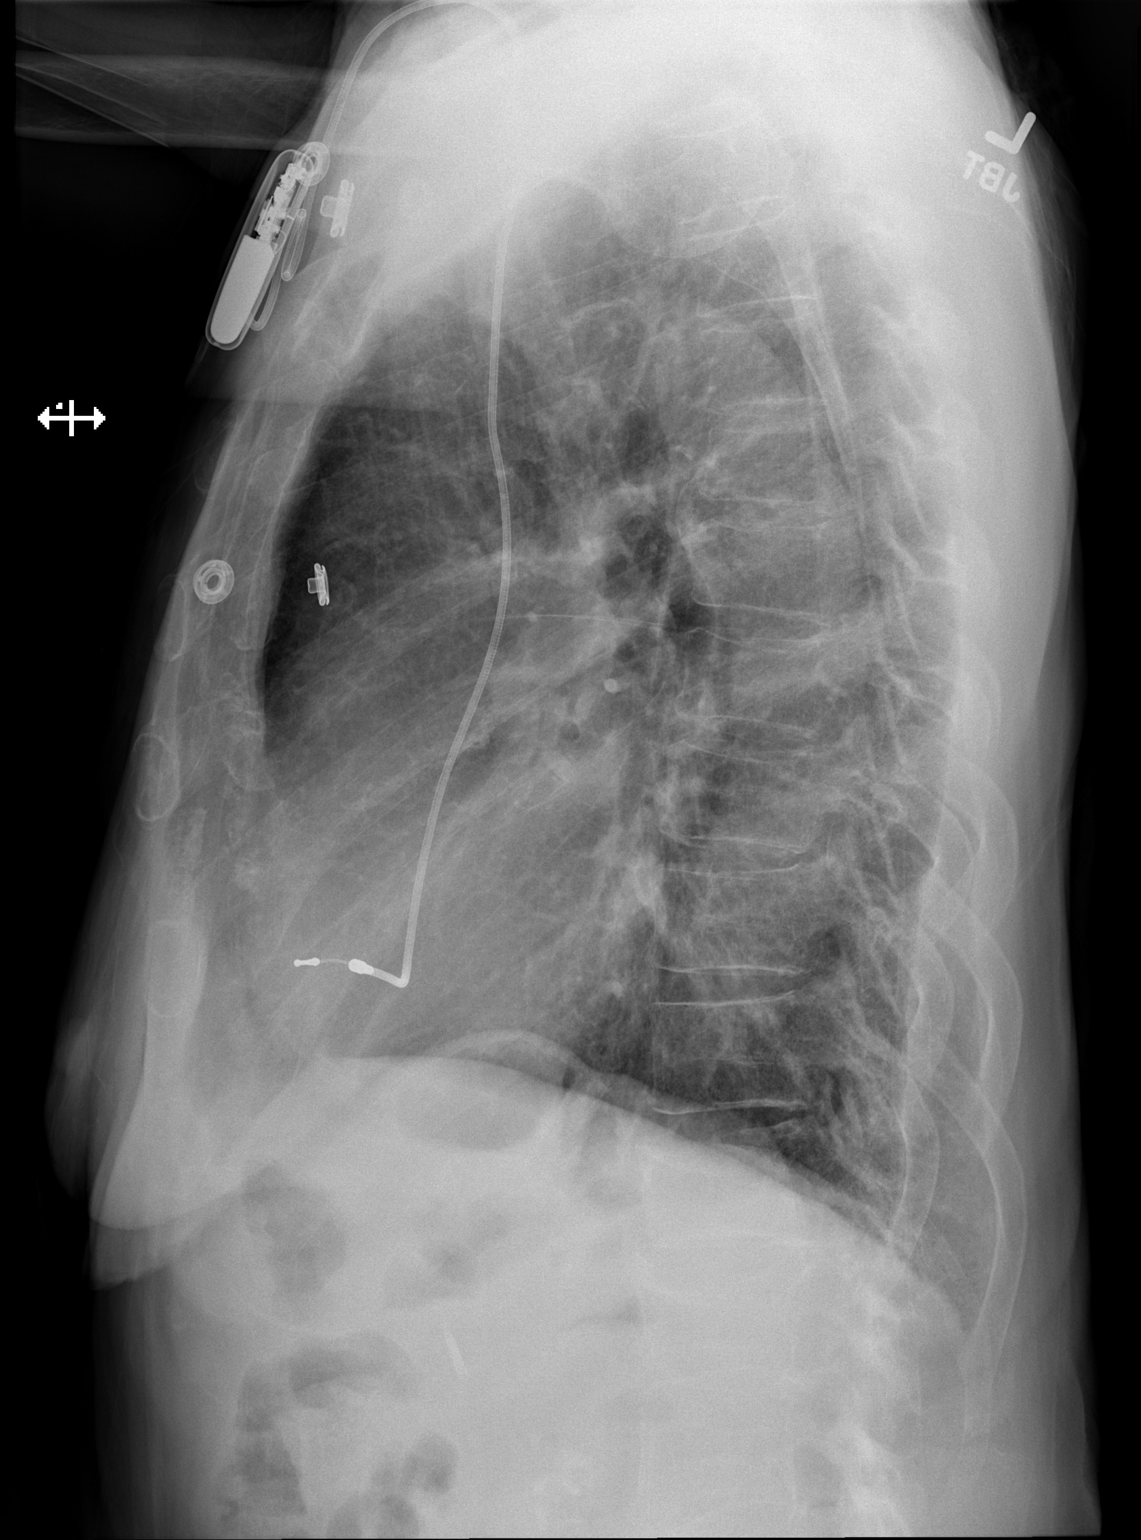

[2 of 2 positions shown; findings below may reference images not displayed]

FINDINGS: Cardiac silhouette mildly enlarged.  Right subclavian
single lead transvenous pacemaker with lead tip at the expected
location the right ventricle.  Thoracic aorta tortuous and
atherosclerotic.  Hilar and mediastinal contours otherwise
unremarkable.  Approximate 1 cm nodule in the left mid lung on the
PA image, likely in the superior segment left lower lobe on the
lateral image.  Emphysematous changes throughout both lungs with
hyperinflation.  Lungs otherwise clear.  No pleural effusions.
Degenerative changes involving the thoracic spine and thoracolumbar
scoliosis convex right.
IMPRESSION: 1.  Approximate 1 cm nodule which I believe is in the superior
segment left lower lobe.  This is suspicious for malignancy.  Non-
emergent CT chest with contrast is suggested in further evaluation.
2.  COPD/emphysema.  No acute cardiopulmonary disease.
3.  Mild cardiomegaly without pulmonary edema.

## 2013-09-17 IMAGING — US US ABDOMEN COMPLETE
1 series · 13 of 25 positions shown · non-contrast
Comparison: None

CLINICAL DATA: Dimension.  Elevated white blood cell count.

ABDOMINAL ULTRASOUND COMPLETE

[Series 1: us abdomen complete · 0.17mm/px · 13 of 87 slices shown]
[im 1/87]
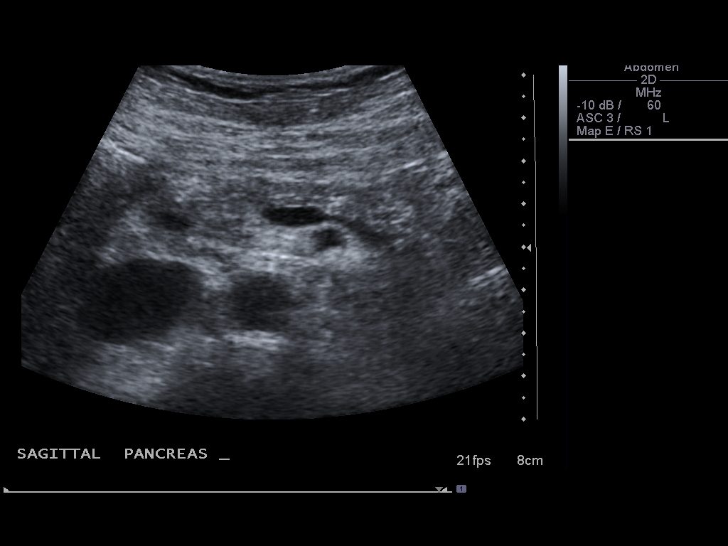
[im 8/87]
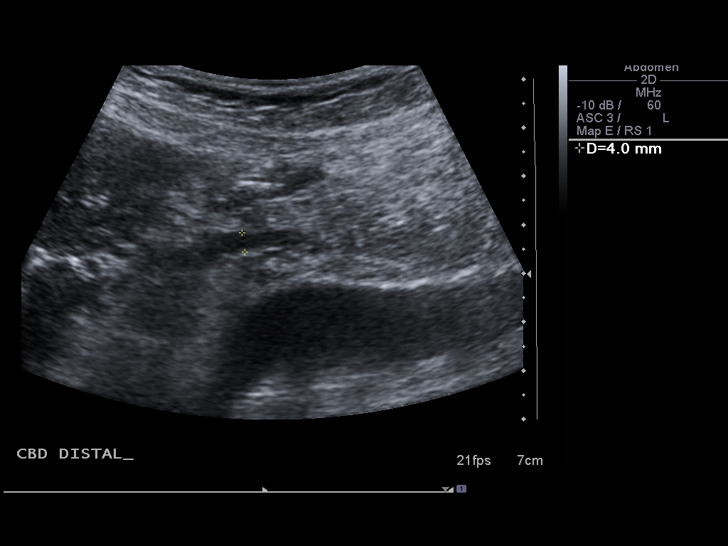
[im 15/87]
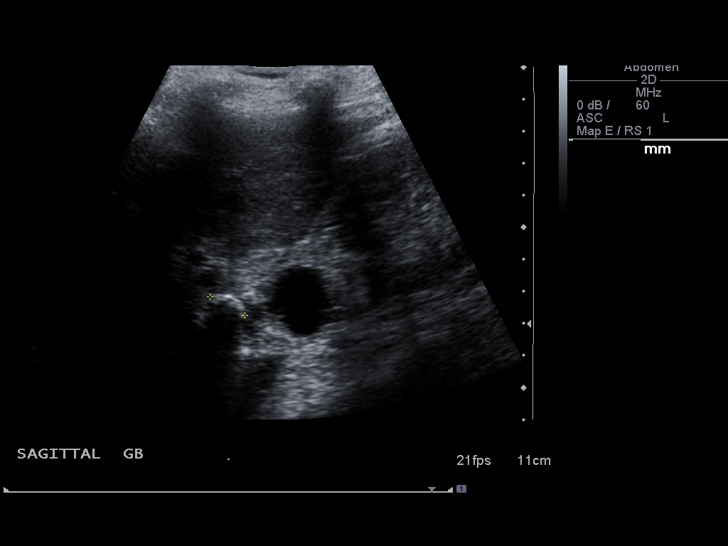
[im 22/87]
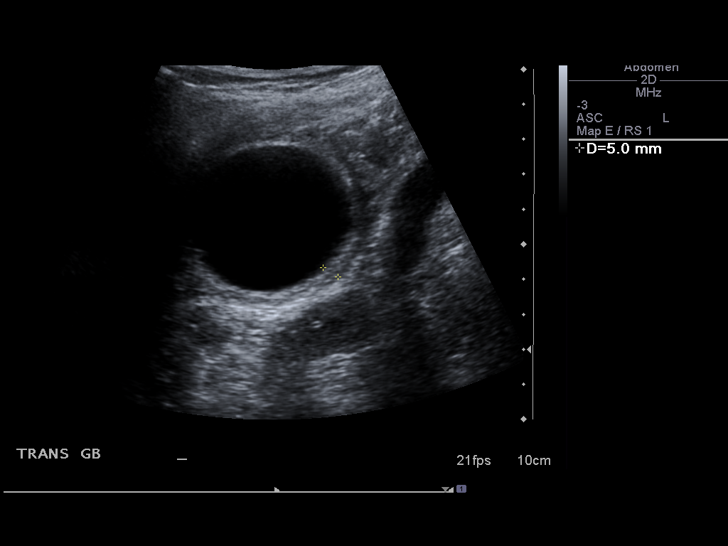
[im 29/87]
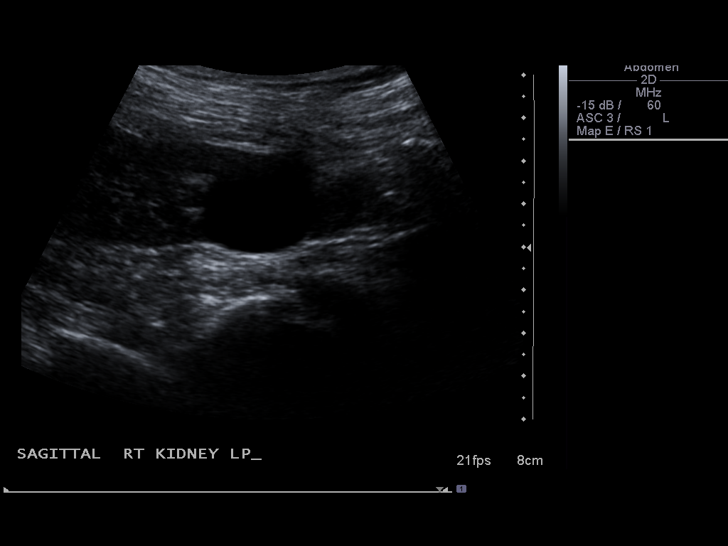
[im 36/87]
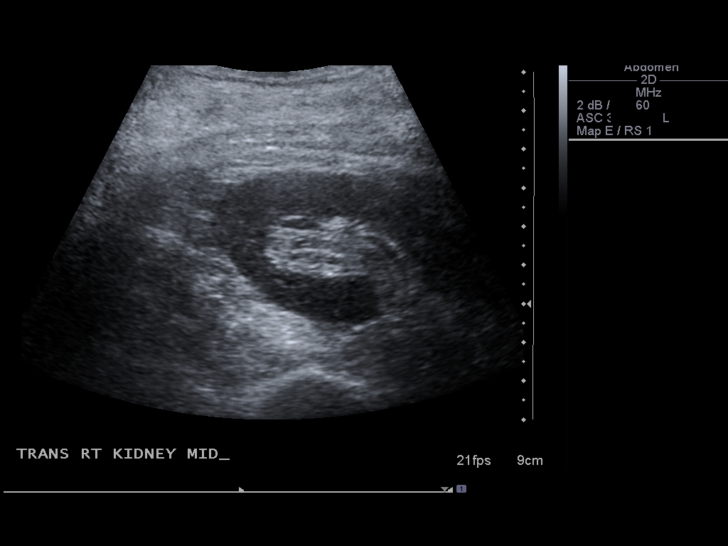
[im 44/87]
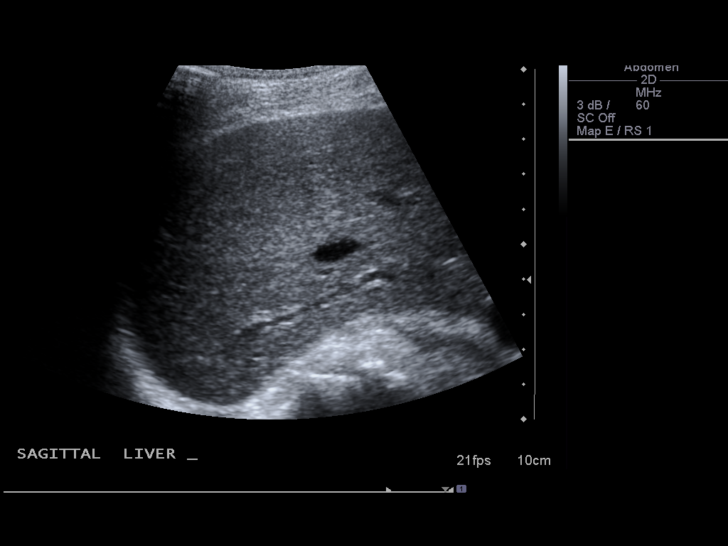
[im 51/87]
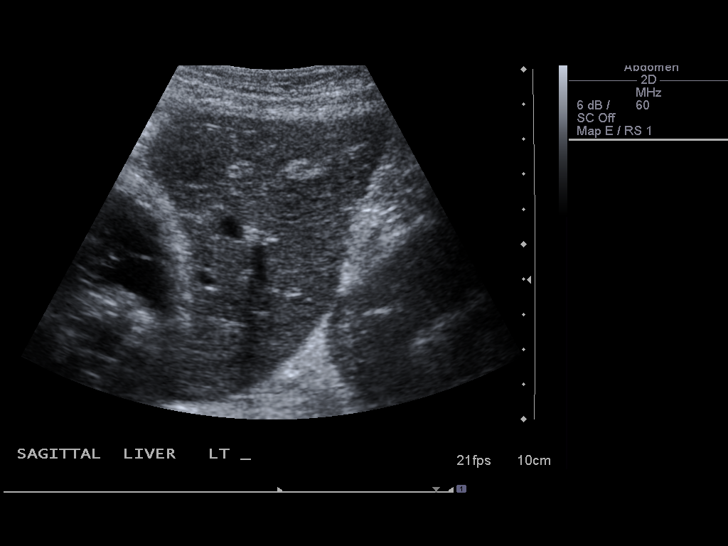
[im 58/87]
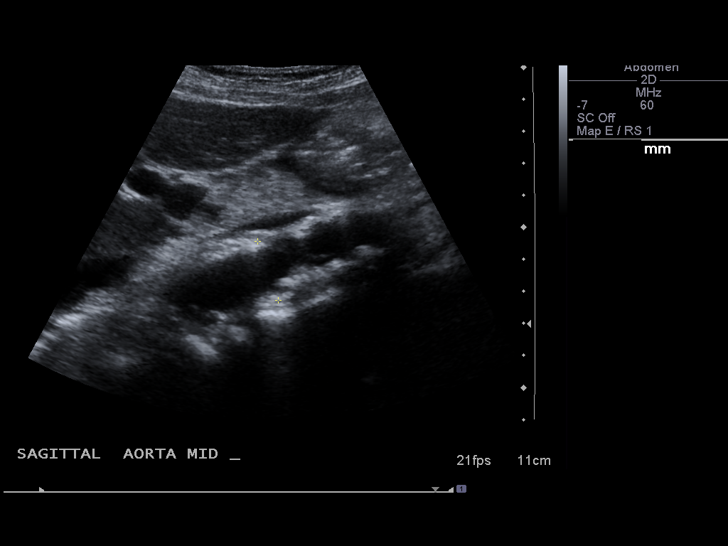
[im 65/87]
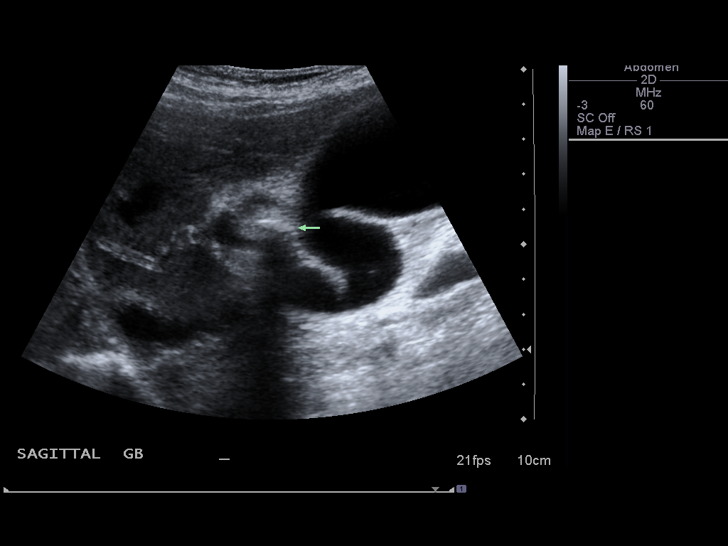
[im 72/87]
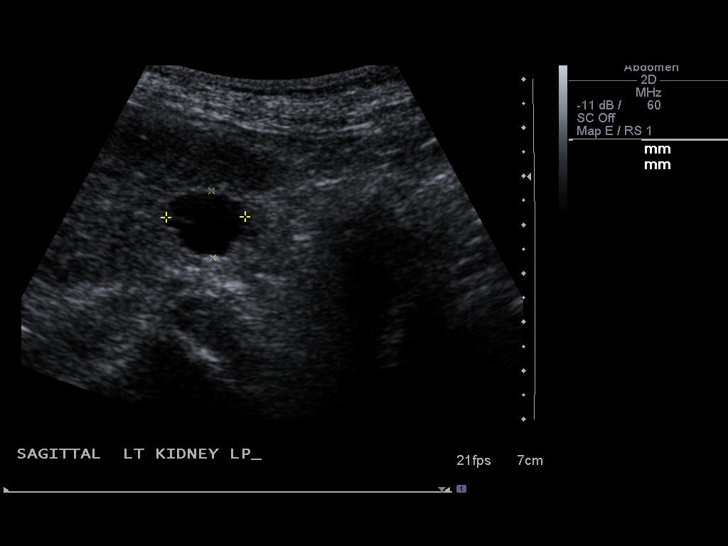
[im 79/87]
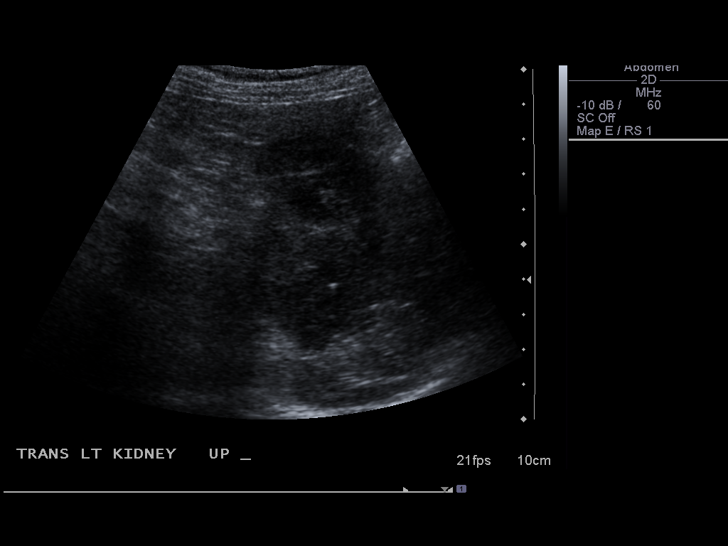
[im 87/87]
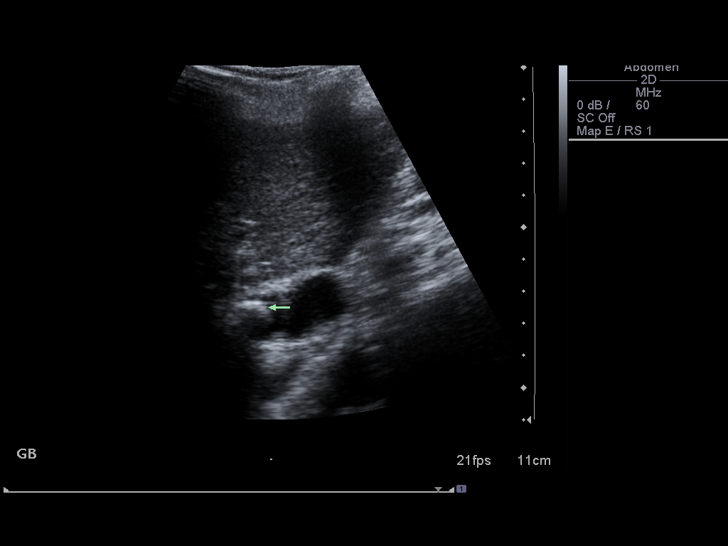

[13 of 25 positions shown; findings below may reference images not displayed]

FINDINGS: Gallbladder:  There are several stones and sludge identified within
the gallbladder.  Stone within the gallbladder neck measures
cm.  The gallbladder wall is thickened measuring 4.9 mm in
thickness. Amount of pericholecystic fluid.  Cannot assess for
sonographic Murphy's sign due to the patient's altered mental
status.

Common Bile Duct:  Within normal limits in caliber.

Liver: No focal mass lesion identified.  Within normal limits in
parenchymal echogenicity.

IVC:  Appears normal.

Pancreas:  No abnormality identified.

Spleen:  Within normal limits in size and echotexture.

Right kidney:  Normal in size and parenchymal echogenicity.  No
evidence of mass or hydronephrosis. Simple cyst is noted within the
inferior pole measuring 2.7 x 2.3 x 2.8 cm.

Left kidney:  Normal in size and parenchymal echogenicity.  No
evidence of mass or hydronephrosis. Simple cyst is noted within the
inferior pole measuring 1.6 x 1.4 x 1.4 cm.

Abdominal Aorta:  No aneurysm identified.
IMPRESSION: 1.  Examination is positive for gallstones, gallbladder wall
thickening and pericholecystic fluid.  Findings concerning for
cholecystitis.
2.  Renal cysts.

## 2013-09-18 ENCOUNTER — Encounter: Payer: Medicare Other | Admitting: *Deleted

## 2013-09-18 ENCOUNTER — Telehealth: Payer: Self-pay | Admitting: Cardiology

## 2013-09-18 NOTE — Telephone Encounter (Signed)
Attempted to call pt x1 to confirm remote transmission for today. No answer.

## 2013-09-19 ENCOUNTER — Encounter: Payer: Self-pay | Admitting: Cardiology

## 2013-09-21 ENCOUNTER — Ambulatory Visit: Payer: Medicare Other | Admitting: Cardiology

## 2013-09-22 ENCOUNTER — Other Ambulatory Visit: Payer: Self-pay | Admitting: Cardiology

## 2013-09-29 ENCOUNTER — Other Ambulatory Visit: Payer: Self-pay | Admitting: Cardiology

## 2013-10-10 ENCOUNTER — Encounter: Payer: Self-pay | Admitting: Cardiology

## 2013-10-24 ENCOUNTER — Other Ambulatory Visit: Payer: Self-pay | Admitting: Cardiology

## 2013-11-04 DIAGNOSIS — S22000A Wedge compression fracture of unspecified thoracic vertebra, initial encounter for closed fracture: Secondary | ICD-10-CM | POA: Insufficient documentation

## 2013-11-04 DIAGNOSIS — S32000A Wedge compression fracture of unspecified lumbar vertebra, initial encounter for closed fracture: Secondary | ICD-10-CM | POA: Insufficient documentation

## 2013-11-24 ENCOUNTER — Other Ambulatory Visit: Payer: Self-pay | Admitting: Cardiology

## 2013-11-30 ENCOUNTER — Other Ambulatory Visit: Payer: Self-pay | Admitting: Cardiology

## 2013-12-06 ENCOUNTER — Encounter: Payer: Self-pay | Admitting: Cardiology

## 2013-12-29 ENCOUNTER — Other Ambulatory Visit: Payer: Self-pay | Admitting: Cardiology

## 2013-12-29 NOTE — Telephone Encounter (Signed)
**Note De-Identified  Obfuscation** The pt was seen by Dr Rayann Heman on 06/16/13 and was advised to f/u in 1 year. Can this be refilled under his name as Digoxin is a antiarrhymic medication?

## 2013-12-29 NOTE — Telephone Encounter (Signed)
Is this ok to refill? Patient has had multiple warnings to call and schedule an appointment. Please advise. Thanks, MI

## 2013-12-29 NOTE — Telephone Encounter (Signed)
Please advise on refill. See note below. Thanks, MI

## 2014-01-30 ENCOUNTER — Other Ambulatory Visit: Payer: Self-pay | Admitting: Internal Medicine

## 2014-03-04 ENCOUNTER — Other Ambulatory Visit: Payer: Self-pay | Admitting: Internal Medicine

## 2014-05-04 ENCOUNTER — Encounter: Payer: Self-pay | Admitting: *Deleted

## 2014-05-08 ENCOUNTER — Other Ambulatory Visit: Payer: Self-pay | Admitting: Cardiology

## 2014-06-06 ENCOUNTER — Other Ambulatory Visit: Payer: Self-pay | Admitting: Internal Medicine

## 2014-06-28 ENCOUNTER — Other Ambulatory Visit: Payer: Self-pay | Admitting: Internal Medicine

## 2014-08-28 ENCOUNTER — Other Ambulatory Visit: Payer: Self-pay | Admitting: Cardiology

## 2014-08-29 ENCOUNTER — Encounter: Payer: Self-pay | Admitting: Cardiology

## 2014-08-29 ENCOUNTER — Ambulatory Visit (INDEPENDENT_AMBULATORY_CARE_PROVIDER_SITE_OTHER): Payer: Medicare Other | Admitting: Cardiology

## 2014-08-29 VITALS — BP 116/60 | HR 64 | Ht 63.0 in | Wt 140.4 lb

## 2014-08-29 DIAGNOSIS — I482 Chronic atrial fibrillation, unspecified: Secondary | ICD-10-CM

## 2014-08-29 DIAGNOSIS — R072 Precordial pain: Secondary | ICD-10-CM

## 2014-08-29 DIAGNOSIS — Z950 Presence of cardiac pacemaker: Secondary | ICD-10-CM

## 2014-08-29 MED ORDER — DIGOXIN 125 MCG PO TABS: 0.1250 mg | ORAL_TABLET | Freq: Every day | ORAL | Status: DC

## 2014-08-29 NOTE — Assessment & Plan Note (Signed)
The patient has chronic atrial fibrillation. Amiodarone had been used in the past but was stopped. She has ventricular pacing. Her rate is controlled. In the past it was felt that she was not a good candidate for Coumadin because of a significant diverticular bleed in the past. Last year Dr. Rayann Heman raises the question that it might be safe for her to be on Eliquis. At this time she remains on Plavix. She is on this medication for history of a CVA in the past. I'm concerned about considering an anticoagulated at this time. In addition to her significant GI bleed in the past, she does appear to be frail. I am worried about the fall risk. I will not be changing her medicines today. She will be seeing Dr. Rayann Heman in the near future.

## 2014-08-29 NOTE — Assessment & Plan Note (Signed)
She is not having any significant chest pain. No further workup.

## 2014-08-29 NOTE — Progress Notes (Signed)
Cardiology Office Note   Date:  08/29/2014   ID:  Brooke Griffin, DOB Jan 24, 1926, MRN [id number]  PCP:  Wonda Cerise, MD  Cardiologist:  Dola Argyle, MD   Chief Complaint  Patient presents with  . Appointment    Follow-up atrial fibrillation      History of Present Illness: Brooke Griffin is a 81 y.o. female who presents to follow-up atrial fibrillation. I saw her last July, 2014. She was seen by Dr. Rayann Heman April, 2015. At that time, he raised the question of possibly switching her Plavix to  Eliquis. She has not been seen since. She did have a fall since that time. She is walking today with a Ramnath and appears unsteady. She's not having any significant chest pain.    Past Medical History  Diagnosis Date  . Hypertension   . Dementia   . GI bleed   . Diverticulosis   . Cholecystitis     Laparoscopic cholecystectomy September 20 01  . Hematoma     Right flank hematoma / cellulitis  September, 2012  . Atrial fibrillation     Not a Coumadin candidate per the hospital record October, 2012,,, Chronic  /      September, 2012, rapid rate, controlled  . Pleural effusion     Right pleural effusion, thoracentesis, October, 2012, for therapeutic reasons  . Ejection fraction     EF 55-60%, echo, November 18, 2010, hypokinesis of the basal inferior wall  . CVA (cerebral infarction)     History of CVA  . Amiodarone     Amiodarone was started during hospitalization September, 2012  . Chest pain     Nuclear, hospital, September, 2012, fixed inferolateral defect and compatible with remote infarct, EF 66%, no ischemia  . Pacemaker     Permanent pacemaker placed in the past.  Data to be obtained  . Sinoatrial node dysfunction     Past Surgical History  Procedure Laterality Date  . Pacemaker insertion    . Hysterectomy - unknown type    . Cholecystectomy  11/21/10    Patient Active Problem List   Diagnosis Date Noted  . Tachycardia-bradycardia   . Cholecystitis chronic,  acute 01/05/2011  . Pacemaker   . Dementia   . Hematoma   . Hypertension   . Atrial fibrillation   . Pleural effusion   . Ejection fraction   . CVA (cerebral infarction)   . Amiodarone   . Chest pain       Current Outpatient Prescriptions  Medication Sig Dispense Refill  . acetaminophen (TYLENOL) 500 MG tablet Take 500 mg by mouth daily as needed. (PAIN)    . clopidogrel (PLAVIX) 75 MG tablet Take 1 tablet (75 mg total) by mouth daily. 90 tablet 3  . digoxin (LANOXIN) 0.125 MG tablet TAKE ONE TABLET BY MOUTH ONCE DAILY (NEED  APPOINTMENT  FOR  MORE  REFILLS) 30 tablet 0  . losartan-hydrochlorothiazide (HYZAAR) 100-12.5 MG per tablet Take 1 tablet by mouth daily.    . metoprolol (LOPRESSOR) 50 MG tablet TAKE ONE TABLET BY MOUTH TWICE DAILY 60 tablet 11  . mirtazapine (REMERON) 30 MG tablet Take 30 mg by mouth at bedtime.     No current facility-administered medications for this visit.    Allergies:   Review of patient's allergies indicates no known allergies.    Social History:  The patient  reports that she has quit smoking. She has never used smokeless tobacco. She reports that she drinks alcohol. She  reports that she does not use illicit drugs.   Family History:  The patient's family history includes Heart disease in her mother and another family member; Hypertension in her mother and another family member.    ROS:  Please see the history of present illness.  Patient denies fever, chills, headache, sweats, rash, change in vision, change in hearing, chest pain, cough, nausea or vomiting, urinary symptoms. All other systems are reviewed and are negative.      PHYSICAL EXAM: VS:  BP 116/60 mmHg  Pulse 64  Ht 5\' 3"  (1.6 m)  Wt 140 lb 6.4 oz (63.685 kg)  BMI 24.88 kg/m2 ,  patient is oriented to person time and place. Her responses are slow. Her daughter is in the room. Head is atraumatic. Sclera and conjunctiva are normal. There is no jugular venous distention. Lungs are  clear. Respiratory effort is nonlabored. Cardiac exam reveals S1 and S2. The abdomen is soft. There is no peripheral edema.  EKG:    EKG is done today and reviewed by me. The rhythm is paced.   Recent Labs: No results found for requested labs within last 365 days.    Lipid Panel No results found for: CHOL, TRIG, HDL, CHOLHDL, VLDL, LDLCALC, LDLDIRECT    Wt Readings from Last 3 Encounters:  08/29/14 140 lb 6.4 oz (63.685 kg)  06/16/13 147 lb (66.679 kg)  05/31/13 152 lb (68.947 kg)      Current medicines are reviewed  The patient's daughter understands her medications.      ASSESSMENT AND PLAN:

## 2014-08-29 NOTE — Assessment & Plan Note (Signed)
Her pacemaker will be assessed in electrophysiology soon.

## 2014-08-29 NOTE — Patient Instructions (Signed)
Medication Instructions:  Same-no change  Labwork: None  Testing/Procedures: None  Follow-Up: Your physician wants you to follow-up in: 1 year. You will receive a reminder letter in the mail two months in advance. If you don't receive a letter, please call our office to schedule the follow-up appointment.      

## 2014-09-19 ENCOUNTER — Encounter: Payer: Self-pay | Admitting: Internal Medicine

## 2014-10-22 ENCOUNTER — Ambulatory Visit (INDEPENDENT_AMBULATORY_CARE_PROVIDER_SITE_OTHER): Payer: Medicare Other | Admitting: Internal Medicine

## 2014-10-22 ENCOUNTER — Encounter: Payer: Self-pay | Admitting: Internal Medicine

## 2014-10-22 VITALS — BP 122/60 | HR 70 | Ht 63.0 in | Wt 144.4 lb

## 2014-10-22 DIAGNOSIS — I495 Sick sinus syndrome: Secondary | ICD-10-CM

## 2014-10-22 DIAGNOSIS — I482 Chronic atrial fibrillation, unspecified: Secondary | ICD-10-CM

## 2014-10-22 DIAGNOSIS — I10 Essential (primary) hypertension: Secondary | ICD-10-CM | POA: Diagnosis not present

## 2014-10-22 LAB — CUP PACEART INCLINIC DEVICE CHECK
Battery Impedance: 6691 Ohm
Battery Remaining Longevity: 3 mo
Battery Voltage: 2.6 V
Date Time Interrogation Session: 20160829114905
Lead Channel Impedance Value: 0 Ohm
Lead Channel Impedance Value: 711 Ohm
Lead Channel Pacing Threshold Pulse Width: 0.4 ms
Lead Channel Setting Pacing Pulse Width: 0.4 ms
MDC IDC MSMT LEADCHNL RV PACING THRESHOLD AMPLITUDE: 0.75 V
MDC IDC MSMT LEADCHNL RV SENSING INTR AMPL: 4 mV
MDC IDC SET LEADCHNL RV PACING AMPLITUDE: 2.5 V
MDC IDC SET LEADCHNL RV SENSING SENSITIVITY: 2 mV
MDC IDC STAT BRADY RV PERCENT PACED: 51 %

## 2014-10-22 NOTE — Progress Notes (Signed)
PCP: Wonda Cerise, MD Primary Cardiologist:  Dr Rayford Halsted is a 81 y.o. female who presents today for routine electrophysiology followup.  Since last being seen in our clinic, the patient reports doing reasonably well. Today, she denies symptoms of palpitations, chest pain, shortness of breath, lower extremity edema, dizziness, presyncope, or syncope.  The patient is otherwise without complaint today.   Past Medical History  Diagnosis Date  . Hypertension   . Dementia   . GI bleed   . Diverticulosis   . Cholecystitis     Laparoscopic cholecystectomy September 20 01  . Hematoma     Right flank hematoma / cellulitis  September, 2012  . Atrial fibrillation     Not a Coumadin candidate per the hospital record October, 2012,,, Chronic  /      September, 2012, rapid rate, controlled  . Pleural effusion     Right pleural effusion, thoracentesis, October, 2012, for therapeutic reasons  . Ejection fraction     EF 55-60%, echo, November 18, 2010, hypokinesis of the basal inferior wall  . CVA (cerebral infarction)     History of CVA  . Amiodarone     Amiodarone was started during hospitalization September, 2012  . Chest pain     Nuclear, hospital, September, 2012, fixed inferolateral defect and compatible with remote infarct, EF 66%, no ischemia  . Pacemaker     Permanent pacemaker placed in the past.  Data to be obtained  . Sinoatrial node dysfunction    Past Surgical History  Procedure Laterality Date  . Pacemaker insertion    . Hysterectomy - unknown type    . Cholecystectomy  11/21/10    Current Outpatient Prescriptions  Medication Sig Dispense Refill  . acetaminophen (TYLENOL) 500 MG tablet Take 500 mg by mouth daily as needed. (PAIN)    . clopidogrel (PLAVIX) 75 MG tablet Take 1 tablet (75 mg total) by mouth daily. 90 tablet 3  . digoxin (LANOXIN) 0.125 MG tablet Take 1 tablet (0.125 mg total) by mouth daily. 90 tablet 3  . losartan-hydrochlorothiazide (HYZAAR)  100-12.5 MG per tablet Take 1 tablet by mouth daily.    . metoprolol (LOPRESSOR) 50 MG tablet TAKE ONE TABLET BY MOUTH TWICE DAILY 60 tablet 11  . mirtazapine (REMERON) 30 MG tablet Take 30 mg by mouth at bedtime.     No current facility-administered medications for this visit.    Physical Exam: Filed Vitals:   10/22/14 1026  BP: 122/60  Pulse: 70  Height: 5\' 3"  (1.6 m)  Weight: 65.499 kg (144 lb 6.4 oz)    GEN- The patient is elderly appearing, alert and oriented x 3 today.   Head- normocephalic, atraumatic Eyes-  Sclera clear, conjunctiva pink Ears- hearing intact Oropharynx- clear Lungs- Clear to ausculation bilaterally, normal work of breathing Chest- pacemaker pocket is well healed Heart- Regular rate and rhythm (paced) GI- soft, NT, ND, + BS Extremities- no clubbing, cyanosis, or edema  Pacemaker interrogation- reviewed in detail today,  See PACEART report  Assessment and Plan:  1. tachycardia/ bradycardia Normal pacemaker function See Pace Art report No changes today Approaching ERI Risks, benefits, and alternatives to PPM pulse generator replacement were discussed at length with patient and daughter today. She lives with daughter who is her POA.  Once ERI, ok to schedule without another office visit.  Her daughter is aware that she will need to accompany her to the visit.  Hold plavix x 7 days prior to gen change.  Monthly Carelinks   2. Permanent atrial fibrillation Rate controlled chads2vasc score is at least 6.  She is on plavix presently.  Per Dr Ron Parker, she is a poor candidate for anticoagulation.  3. htn Stable No change required today  carelink Return to see me in 1 year unless she reaches ERI in the interim. Follow-up with Dr Ron Parker as scheduled

## 2014-10-22 NOTE — Patient Instructions (Signed)
Medication Instructions:  Your physician recommends that you continue on your current medications as directed. Please refer to the Current Medication list given to you today.   Labwork: None ordered  Testing/Procedures: None ordered  Follow-Up: Remote monitoring is used to monitor your Pacemaker o from home. This monitoring reduces the number of office visits required to check your device to one time per year. It allows [place] to keep an eye on the functioning of your device to ensure it is working properly. You are scheduled for a device check from home on 11/19/14. You may send your transmission at any time that day. If you have a wireless device, the transmission will be sent automatically. After your physician reviews your transmission, you will receive a postcard with your next transmission date.   Your physician wants you to follow-up in: 12 months with Dr Vallery Ridge will receive a reminder letter in the mail two months in advance. If you don't receive a letter, please call our office to schedule the follow-up appointment.   Any Other Special Instructions Will Be Listed Below (If Applicable).

## 2014-10-30 ENCOUNTER — Encounter: Payer: Self-pay | Admitting: Internal Medicine

## 2014-11-19 ENCOUNTER — Encounter: Payer: Medicare Other | Admitting: *Deleted

## 2014-11-19 ENCOUNTER — Telehealth: Payer: Self-pay | Admitting: Cardiology

## 2014-11-19 NOTE — Telephone Encounter (Signed)
LMOVM reminding pt to send remote transmission.   

## 2014-11-20 ENCOUNTER — Encounter: Payer: Self-pay | Admitting: Cardiology

## 2014-12-05 ENCOUNTER — Other Ambulatory Visit: Payer: Self-pay | Admitting: Cardiology

## 2014-12-05 MED ORDER — METOPROLOL TARTRATE 50 MG PO TABS: 50.0000 mg | ORAL_TABLET | Freq: Two times a day (BID) | ORAL | Status: AC

## 2014-12-05 MED ORDER — CLOPIDOGREL BISULFATE 75 MG PO TABS: 75.0000 mg | ORAL_TABLET | Freq: Every day | ORAL | Status: DC

## 2015-03-18 DIAGNOSIS — F32A Depression, unspecified: Secondary | ICD-10-CM | POA: Insufficient documentation

## 2015-03-18 DIAGNOSIS — I10 Essential (primary) hypertension: Secondary | ICD-10-CM | POA: Insufficient documentation

## 2015-03-18 DIAGNOSIS — D751 Secondary polycythemia: Secondary | ICD-10-CM | POA: Insufficient documentation

## 2015-03-18 DIAGNOSIS — F329 Major depressive disorder, single episode, unspecified: Secondary | ICD-10-CM | POA: Insufficient documentation

## 2015-03-18 DIAGNOSIS — K5790 Diverticulosis of intestine, part unspecified, without perforation or abscess without bleeding: Secondary | ICD-10-CM | POA: Insufficient documentation

## 2015-03-18 DIAGNOSIS — G4700 Insomnia, unspecified: Secondary | ICD-10-CM | POA: Insufficient documentation

## 2015-03-18 DIAGNOSIS — R739 Hyperglycemia, unspecified: Secondary | ICD-10-CM | POA: Insufficient documentation

## 2015-07-31 ENCOUNTER — Emergency Department (HOSPITAL_COMMUNITY): Payer: Medicare HMO

## 2015-07-31 ENCOUNTER — Encounter (HOSPITAL_COMMUNITY): Payer: Self-pay | Admitting: Emergency Medicine

## 2015-07-31 ENCOUNTER — Emergency Department (HOSPITAL_COMMUNITY)
Admission: EM | Admit: 2015-07-31 | Discharge: 2015-07-31 | Disposition: A | Discharge status: Home / Self Care | Payer: Medicare HMO | Attending: Emergency Medicine | Admitting: Emergency Medicine

## 2015-07-31 DIAGNOSIS — I10 Essential (primary) hypertension: Secondary | ICD-10-CM | POA: Diagnosis not present

## 2015-07-31 DIAGNOSIS — Z79899 Other long term (current) drug therapy: Secondary | ICD-10-CM | POA: Insufficient documentation

## 2015-07-31 DIAGNOSIS — M545 Low back pain: Secondary | ICD-10-CM | POA: Diagnosis present

## 2015-07-31 DIAGNOSIS — I4891 Unspecified atrial fibrillation: Secondary | ICD-10-CM | POA: Diagnosis not present

## 2015-07-31 DIAGNOSIS — Z87891 Personal history of nicotine dependence: Secondary | ICD-10-CM | POA: Insufficient documentation

## 2015-07-31 DIAGNOSIS — M4856XA Collapsed vertebra, not elsewhere classified, lumbar region, initial encounter for fracture: Secondary | ICD-10-CM | POA: Insufficient documentation

## 2015-07-31 DIAGNOSIS — F0390 Unspecified dementia without behavioral disturbance: Secondary | ICD-10-CM | POA: Insufficient documentation

## 2015-07-31 DIAGNOSIS — M5441 Lumbago with sciatica, right side: Secondary | ICD-10-CM

## 2015-07-31 DIAGNOSIS — Z950 Presence of cardiac pacemaker: Secondary | ICD-10-CM | POA: Insufficient documentation

## 2015-07-31 DIAGNOSIS — R296 Repeated falls: Secondary | ICD-10-CM | POA: Insufficient documentation

## 2015-07-31 DIAGNOSIS — Z8673 Personal history of transient ischemic attack (TIA), and cerebral infarction without residual deficits: Secondary | ICD-10-CM | POA: Insufficient documentation

## 2015-07-31 DIAGNOSIS — M5431 Sciatica, right side: Secondary | ICD-10-CM | POA: Insufficient documentation

## 2015-07-31 MED ORDER — PREDNISONE 20 MG PO TABS
60.0000 mg | ORAL_TABLET | Freq: Once | ORAL | Status: AC
  Administered 2015-07-31: 60 mg via ORAL
  Filled 2015-07-31: qty 3

## 2015-07-31 MED ORDER — TRAMADOL HCL 50 MG PO TABS
50.0000 mg | ORAL_TABLET | Freq: Once | ORAL | Status: AC
  Administered 2015-07-31: 50 mg via ORAL
  Filled 2015-07-31: qty 1

## 2015-07-31 MED ORDER — PREDNISONE 20 MG PO TABS: 20.0000 mg | ORAL_TABLET | Freq: Two times a day (BID) | ORAL | Status: DC

## 2015-07-31 MED ORDER — TRAMADOL HCL 50 MG PO TABS: 50.0000 mg | ORAL_TABLET | Freq: Four times a day (QID) | ORAL | Status: DC | PRN

## 2015-07-31 NOTE — ED Notes (Signed)
Pt to ER by private vehicle accompanied by daughter who is POA. Pt has dementia. Pt has hx of spinal stenosis and "every now and then" her back will "go out on her and cause her right leg to have problems." pt daughter reports pt at baseline uses a Racz to ambulate, however this week the pain has been so bad that she cannot walk at all and has had multiple falls. Pt herself is a poor historian and does not answer questions appropriately. Moving all extremities while lying in stretcher. Hx of stroke 17 years prior to this visit that caused weakness to right leg. Alert to self. Pt daughter reports tylenol use all week without any relief. Pt does not appear to be in pain at present.

## 2015-07-31 NOTE — ED Provider Notes (Signed)
CSN: [id number]     Arrival date & time 07/31/15  1003 History   First MD Initiated Contact with Patient 07/31/15 1039     Chief Complaint  Patient presents with  . Extremity Weakness  . Back Pain     (Consider location/radiation/quality/duration/timing/severity/associated sxs/prior Treatment) HPI   Brooke Griffin is a 82 y.o. female  who presents for evaluation of low back pain radiating to the right leg which has made it difficult to walk for 1 week. This is a recurrent problem for her. She fell yesterday, injuring her right knee and right elbow. She's taking Tylenol without relief of the pain. There's been no loss of bowel or bladder function. Is been no fever, chills or vomiting. The patient is absolutely unable to walk, and the daughter states that she "has to carry her."  When she has had this before, she takes Tylenol with relief. This time she tried Tylenol and has not gotten any better. History is from the patient's daughter. Patient cannot give history.   Level V caveat- Brooke Griffin   Past Medical History  Diagnosis Date  . Hypertension   . Brooke Griffin   . GI bleed   . Diverticulosis   . Cholecystitis     Laparoscopic cholecystectomy September 20 01  . Hematoma     Right flank hematoma / cellulitis  September, 2012  . Atrial fibrillation [facility])     Not a Coumadin candidate per the hospital record October, 2012,,, Chronic  /      September, 2012, rapid rate, controlled  . Pleural effusion     Right pleural effusion, thoracentesis, October, 2012, for therapeutic reasons  . Ejection fraction     EF 55-60%, echo, November 18, 2010, hypokinesis of the basal inferior wall  . CVA (cerebral infarction)     History of CVA  . Amiodarone     Amiodarone was started during hospitalization September, 2012  . Chest pain     Nuclear, hospital, September, 2012, fixed inferolateral defect and compatible with remote infarct, EF 66%, no ischemia  . Pacemaker     Permanent pacemaker placed in  the past.  Data to be obtained  . Sinoatrial node dysfunction [facility])    Past Surgical History  Procedure Laterality Date  . Pacemaker insertion    . Hysterectomy - unknown type    . Cholecystectomy  11/21/10   Family History  Problem Relation Age of Onset  . Heart disease    . Hypertension    . Hypertension Mother   . Heart disease Mother     heart attack   Social History  Substance Use Topics  . Smoking status: Former Research scientist (life sciences)  . Smokeless tobacco: Never Used  . Alcohol Use: 0.0 oz/week    0 Standard drinks or equivalent per week     Comment: occasional   OB History    No data available     Review of Systems  Unable to perform ROS: Brooke Griffin      Allergies  Review of patient's allergies indicates no known allergies.  Home Medications   Prior to Admission medications   Medication Sig Start Date End Date Taking? Authorizing Provider  acetaminophen (TYLENOL) 500 MG tablet Take 500 mg by mouth daily as needed. (PAIN)   Yes Historical Provider, MD  clopidogrel (PLAVIX) 75 MG tablet Take 1 tablet (75 mg total) by mouth daily. 12/05/14  Yes Carlena Bjornstad, MD  digoxin (LANOXIN) 0.125 MG tablet Take 1 tablet (0.125 mg total) by mouth  daily. 08/29/14  Yes Carlena Bjornstad, MD  losartan-hydrochlorothiazide [facility]) 100-12.5 MG per tablet Take 1 tablet by mouth daily. 06/30/14  Yes Historical Provider, MD  metoprolol (LOPRESSOR) 50 MG tablet Take 1 tablet (50 mg total) by mouth 2 (two) times daily. 12/05/14  Yes Carlena Bjornstad, MD  mirtazapine (REMERON) 30 MG tablet Take 30 mg by mouth at bedtime. 06/30/14  Yes Historical Provider, MD  predniSONE (DELTASONE) 20 MG tablet Take 1 tablet (20 mg total) by mouth 2 (two) times daily. 07/31/15   Daleen Bo, MD  traMADol (ULTRAM) 50 MG tablet Take 1 tablet (50 mg total) by mouth every 6 (six) hours as needed. 07/31/15   Daleen Bo, MD   BP 94/57 mmHg  Pulse 60  Temp(Src) 97.3 F (36.3 C) (Oral)  Resp 16  SpO2 98% Physical Exam   Constitutional: She is oriented to person, place, and time. She appears well-developed and well-nourished.  HENT:  Head: Normocephalic and atraumatic.  Right Ear: External ear normal.  Left Ear: External ear normal.  Eyes: Conjunctivae and EOM are normal. Pupils are equal, round, and reactive to light.  Neck: Normal range of motion and phonation normal. Neck supple.  Cardiovascular: Normal rate, regular rhythm and normal heart sounds.   Pulmonary/Chest: Effort normal and breath sounds normal. She exhibits no bony tenderness.  Abdominal: Soft. There is no tenderness.  Musculoskeletal: Normal range of motion. She exhibits no tenderness.  Mild bilateral lower lumbar tenderness. No deformity of the cervical, thoracic or lumbar spines. Normal range of motion, arms, legs, bilaterally. She is able to elevate both legs off the stretcher independently for 5 seconds.  Neurological: She is alert and oriented to person, place, and time. No cranial nerve deficit or sensory deficit. She exhibits normal muscle tone. Coordination normal.  Skin: Skin is warm, dry and intact.  Psychiatric: She has a normal mood and affect. Her behavior is normal.  Nursing note and vitals reviewed.   ED Course  Procedures (including critical care time)  Medications  predniSONE (DELTASONE) tablet 60 mg (60 mg Oral Given 07/31/15 1317)  traMADol (ULTRAM) tablet 50 mg (50 mg Oral Given 07/31/15 1317)    Patient Vitals for the past 24 hrs:  BP Temp Temp src Pulse Resp SpO2  07/31/15 1024 94/57 mmHg 97.3 F (36.3 C) Oral 60 16 98 %    12:32 PM Reevaluation with update and discussion. After initial assessment and treatment, an updated evaluation reveals no change in status. We contacted the patient's PCP, and her orthopedist, in [place], [place]. They did not have any information to offer to help with her current problem. Alianis Trimmer L   2:18 PM Reevaluation with update and discussion. After initial assessment and  treatment, an updated evaluation reveals She remains comfortable at rest. Findings discussed with patient's daughter and all questions answered. [place] Review Labs Reviewed - No data to display  Imaging Review Ct Lumbar Spine Wo Contrast  07/31/2015  CLINICAL DATA:  Low back pain. Right leg pain. Multiple falls over the last week. Unable to walk due to the pain. EXAM: CT LUMBAR SPINE WITHOUT CONTRAST TECHNIQUE: Multidetector CT imaging of the lumbar spine was performed without intravenous contrast administration. Multiplanar CT image reconstructions were also generated. COMPARISON:  Lumbar spine radiographs 06/01/2013 and CT of the abdomen 11/28/2010. FINDINGS: Five non rib-bearing lumbar type vertebral bodies are present. Levoconvex curvature is again noted at L4. A knee a superior endplate compression fracture is present at  L2. There was a fracture on the plain film radiographs 4/9/ 15. This has progressed and is worse on the right side. There is no retropulsion of bone L the disc disease at L2-3 is worse. Limited imaging of the abdomen demonstrates atherosclerotic calcifications of the aorta. Multiple bilateral renal cysts are similar to the prior study. Atherosclerotic calcifications are present within the left renal artery is well. A high-density cyst in the left kidney is stable. T12-L1: There leftward disc protrusion results in moderate left foraminal stenosis. There is mild left subarticular narrowing is well. The right foramen is patent. L2-3: A leftward disc protrusion is present. Mild facet hypertrophy is noted. This results and moderate foraminal narrowing bilaterally, worse on the left. L3-4: A broad-based disc protrusion is present. Advanced facet hypertrophy is worse on the right. Severe central and right foraminal stenosis is present. Moderate left foraminal narrowing is noted. L4-5: A broad-based disc protrusion is present. Advanced facet hypertrophy and ligamentum flavum  thickening is noted. This results in severe central canal stenosis. Moderate foraminal narrowing is evident bilaterally. L5-S1: A broad-based disc protrusion is present. Advanced facet hypertrophy and ligamentum flavum thickening is noted. This results in moderate subarticular stenosis. Severe left and moderate right foraminal narrowing is present. IMPRESSION: 1. Progression of L2 compression fracture. The CT appearance is of an unhealed fracture. Height loss is worse on the right. 2. Remote superior endplate fractures at L1 and L4. 3. Multilevel spondylosis of the lumbar spine with central and foraminal narrowing as discussed above. Electronically Signed   By: San Morelle M.D.   On: 07/31/2015 13:47   I have personally reviewed and evaluated these images and lab results as part of my medical decision-making.   EKG Interpretation None      MDM   Final diagnoses:  Low back pain with right-sided sciatica, unspecified back pain laterality  Compression fracture of lumbar spine, non-traumatic, initial encounter   Ultra factorial low back pain, with radiculopathy. Advancing L2 compression fracture based on prior imaging. Doubt lumbar myelopathy.  Nursing Notes Reviewed/ Care Coordinated Applicable Imaging Reviewed Interpretation of Laboratory Data incorporated into ED treatment  The patient appears reasonably screened and/or stabilized for discharge and I doubt any other medical condition or other [facility] requiring further screening, evaluation, or treatment in the ED at this time prior to discharge.  Plan: Home Medications- prednisone, Ultram; Home Treatments- rest; return here if the recommended treatment, does not improve the symptoms; Recommended follow up- PCP follow-up one week     Daleen Bo, MD 07/31/15 1419

## 2015-07-31 NOTE — ED Notes (Signed)
Patient transported to CT 

## 2015-08-14 DIAGNOSIS — S32020A Wedge compression fracture of second lumbar vertebra, initial encounter for closed fracture: Secondary | ICD-10-CM | POA: Insufficient documentation

## 2015-08-14 DIAGNOSIS — M8000XD Age-related osteoporosis with current pathological fracture, unspecified site, subsequent encounter for fracture with routine healing: Secondary | ICD-10-CM | POA: Insufficient documentation

## 2015-09-11 NOTE — Progress Notes (Signed)
  This encounter was created in error - please disregard.    ROS     

## 2015-09-17 DIAGNOSIS — I739 Peripheral vascular disease, unspecified: Secondary | ICD-10-CM | POA: Insufficient documentation

## 2015-09-17 DIAGNOSIS — I70261 Atherosclerosis of native arteries of extremities with gangrene, right leg: Secondary | ICD-10-CM | POA: Insufficient documentation

## 2015-09-18 ENCOUNTER — Telehealth: Payer: Self-pay | Admitting: Cardiology

## 2015-09-18 ENCOUNTER — Encounter (INDEPENDENT_AMBULATORY_CARE_PROVIDER_SITE_OTHER): Payer: Medicare Other | Admitting: Physician Assistant

## 2015-09-18 DIAGNOSIS — E876 Hypokalemia: Secondary | ICD-10-CM | POA: Insufficient documentation

## 2015-09-18 DIAGNOSIS — E870 Hyperosmolality and hypernatremia: Secondary | ICD-10-CM | POA: Insufficient documentation

## 2015-09-18 DIAGNOSIS — I70261 Atherosclerosis of native arteries of extremities with gangrene, right leg: Secondary | ICD-10-CM | POA: Insufficient documentation

## 2015-09-18 DIAGNOSIS — R0989 Other specified symptoms and signs involving the circulatory and respiratory systems: Secondary | ICD-10-CM

## 2015-09-18 DIAGNOSIS — D72829 Elevated white blood cell count, unspecified: Secondary | ICD-10-CM | POA: Insufficient documentation

## 2015-09-18 NOTE — Telephone Encounter (Signed)
[facility] w/ [facility] called and stated that pt was at the hospital for a foot amputation. She needed last device report faxed. Faxed report to [facility]

## 2015-09-19 ENCOUNTER — Encounter: Payer: Self-pay | Admitting: Physician Assistant

## 2015-10-01 ENCOUNTER — Encounter: Payer: Self-pay | Admitting: Physician Assistant

## 2015-11-26 HISTORY — PX: PACEMAKER GENERATOR CHANGE: SHX5998

## 2015-12-13 DIAGNOSIS — R001 Bradycardia, unspecified: Secondary | ICD-10-CM | POA: Insufficient documentation

## 2016-01-22 DIAGNOSIS — S78111A Complete traumatic amputation at level between right hip and knee, initial encounter: Secondary | ICD-10-CM | POA: Insufficient documentation

## 2016-04-06 ENCOUNTER — Other Ambulatory Visit: Payer: Self-pay | Admitting: Cardiology

## 2016-04-29 ENCOUNTER — Ambulatory Visit: Payer: Medicare Other | Admitting: Physician Assistant

## 2016-05-04 NOTE — Progress Notes (Signed)
Cardiology Office Note    Date:  05/05/2016   ID:  Brooke Griffin, DOB 09/19/1925, MRN [id number]  PCP:  Philis Fendt, MD  Cardiologist:  Dr. Rayann Heman / Dr. Ron Parker  CC: follow up   History of Present Illness:  Brooke Griffin is a 83 y.o. female with a history of permanent atrial fibrillation, CVA, HTN, dementia, PAD and tachy-brady s/p PPM who presents to clinic for follow up.   She was last seen by Dr. Rayann Heman 09/2014 and noted to be approaching ERI. Last device check was that month. She has been noted to be a poor candidate for [facility] given history of diverticular bleed and hx of CVA on plavix.    She was recently admitted 08/2015 for Right AKA with revision in 11/2015. She was [facility] for rehabilitation. During her admission for surgery, it was noted that her PPM battery wasn't working and it was replaced by Dr. Lewayne Bunting. She has been following with him since that time. Now she is back living with her daughter in [place] and would like to re-establish care with [place].   Today she presents to clinic for follow up. No CP or SOB. No orthopnea or PND. No dizziness or syncope. No blood in stool or urine. No palpitations. She does have some intermittent left lower extremity swelling. She is stays in a wheelchair most the time. She is working with physical therapy but does have pain with transferring.      Past Medical History:  Diagnosis Date  . Amiodarone    Amiodarone was started during hospitalization September, 2012  . Atrial fibrillation [facility])    Not a Coumadin candidate per the hospital record October, 2012,,, Chronic  /      September, 2012, rapid rate, controlled  . Chest pain    Nuclear, hospital, September, 2012, fixed inferolateral defect and compatible with remote infarct, EF 66%, no ischemia  . Cholecystitis    Laparoscopic cholecystectomy September 20 01  . CVA (cerebral infarction)    History of CVA  . Dementia   . Diverticulosis   . Ejection fraction    EF 55-60%, echo, November 18, 2010, hypokinesis of the basal inferior wall  . GI bleed   . Hematoma    Right flank hematoma / cellulitis  September, 2012  . Hypertension   . Pacemaker    Permanent pacemaker placed in the past.  Data to be obtained  . Pleural effusion    Right pleural effusion, thoracentesis, October, 2012, for therapeutic reasons  . Sinoatrial node dysfunction (HCC)     Past Surgical History:  Procedure Laterality Date  . CHOLECYSTECTOMY  11/21/10  . hysterectomy - unknown type    . PACEMAKER INSERTION      Current Medications: Outpatient Medications Prior to Visit  Medication Sig Dispense Refill  . clopidogrel (PLAVIX) 75 MG tablet Take 1 tablet (75 mg total) by mouth daily. 90 tablet 3  . digoxin (LANOXIN) 0.125 MG tablet Take 1 tablet (0.125 mg total) by mouth daily. 90 tablet 3  . metoprolol (LOPRESSOR) 50 MG tablet Take 1 tablet (50 mg total) by mouth 2 (two) times daily. 180 tablet 3  . mirtazapine (REMERON) 30 MG tablet Take 30 mg by mouth at bedtime.    Marland Kitchen acetaminophen (TYLENOL) 500 MG tablet Take 500 mg by mouth daily as needed. (PAIN)    . losartan-hydrochlorothiazide (HYZAAR) 100-12.5 MG per tablet Take 1 tablet by mouth daily.    . predniSONE (DELTASONE) 20 MG tablet  Take 1 tablet (20 mg total) by mouth 2 (two) times daily. (Patient not taking: Reported on 05/05/2016) 10 tablet 0  . traMADol (ULTRAM) 50 MG tablet Take 1 tablet (50 mg total) by mouth every 6 (six) hours as needed. (Patient not taking: Reported on 05/05/2016) 30 tablet 0   No facility-administered medications prior to visit.      Allergies:   Donepezil   Social History   Social History  . Marital status: Widowed    Spouse name: N/A  . Number of children: N/A  . Years of education: N/A   Social History Main Topics  . Smoking status: Former Research scientist (life sciences)  . Smokeless tobacco: Never Used  . Alcohol use 0.0 oz/week     Comment: occasional  . Drug use: No  . Sexual activity: Not Asked    Other Topics Concern  . None   Social History Narrative  . None     Family History:  The patient's family history includes Heart disease in her mother; Hypertension in her mother.      ROS:   Please see the history of present illness.    ROS All other systems reviewed and are negative.   PHYSICAL EXAM:   VS:  BP 120/60   Pulse 60   Ht 5\' 3"  (1.6 m)    GEN: Well nourished, well developed, in no acute distress, elderly and frail HEENT: normal  Neck: no JVD, carotid bruits, or masses Cardiac: RRR; no murmurs, rubs, or gallops, 1+ LLE.  Respiratory:  clear to auscultation bilaterally, normal work of breathing GI: soft, nontender, nondistended, + BS MS: no deformity or atrophy  Skin: warm and dry, no rash Neuro:  Alert and Oriented x 3, Strength and sensation are intact Psych: euthymic mood, full affect    Wt Readings from Last 3 Encounters:  10/22/14 144 lb 6.4 oz (65.5 kg)  08/29/14 140 lb 6.4 oz (63.7 kg)  06/16/13 147 lb (66.7 kg)      Studies/Labs Reviewed:   EKG:  EKG is ordered today.  V paced HR 60  Recent Labs: No results found for requested labs within last 8760 hours.   Lipid Panel No results found for: CHOL, TRIG, HDL, CHOLHDL, VLDL, LDLCALC, LDLDIRECT  Additional studies/ records that were reviewed today include:  none   ASSESSMENT & PLAN:   Tachy-brady: s/p [employer] PPM. Recent gen change by Dr. Lewayne Bunting. Will get in first available for Dr. Rayann Heman  Permanent atrial fibrillation: she is currently being V paced. Not a candidate for [facility] given dementia, frailty and need for plavix.  HTN: BP well controlled.   Hx of CVA: continue plavix.   Medication Adjustments/Labs and Tests Ordered: Current medicines are reviewed at length with the patient today.  Concerns regarding medicines are outlined above.  Medication changes, Labs and Tests ordered today are listed in the Patient Instructions below. Patient Instructions  Medication Instructions:   Your physician recommends that you continue on your current medications as directed. Please refer to the Current Medication list given to you today.   Labwork: None ordered Testing/Procedures: None ordered  Follow-Up: Your physician recommends that you schedule a follow-up appointment in: SEE DR. ALLRED 1ST AVAILABLE APPOINTMENT   Any Other Special Instructions Will Be Listed Below (If Applicable).   If you need a refill on your cardiac medications before your next appointment, please call your pharmacy.      Signed, Angelena Form, PA-C  05/05/2016 12:01 PM    [facility] [address], [place], [place]  [address] Phone: [phone]; Fax: [phone]

## 2016-05-05 ENCOUNTER — Encounter: Payer: Self-pay | Admitting: Physician Assistant

## 2016-05-05 ENCOUNTER — Ambulatory Visit (INDEPENDENT_AMBULATORY_CARE_PROVIDER_SITE_OTHER): Payer: Medicare HMO | Admitting: Physician Assistant

## 2016-05-05 VITALS — BP 120/60 | HR 60 | Ht 63.0 in

## 2016-05-05 DIAGNOSIS — I495 Sick sinus syndrome: Secondary | ICD-10-CM

## 2016-05-05 DIAGNOSIS — I10 Essential (primary) hypertension: Secondary | ICD-10-CM | POA: Diagnosis not present

## 2016-05-05 DIAGNOSIS — I482 Chronic atrial fibrillation, unspecified: Secondary | ICD-10-CM

## 2016-05-05 DIAGNOSIS — F0390 Unspecified dementia without behavioral disturbance: Secondary | ICD-10-CM

## 2016-05-05 DIAGNOSIS — Z8673 Personal history of transient ischemic attack (TIA), and cerebral infarction without residual deficits: Secondary | ICD-10-CM

## 2016-05-05 NOTE — Patient Instructions (Addendum)
Medication Instructions:  Your physician recommends that you continue on your current medications as directed. Please refer to the Current Medication list given to you today.   Labwork: None ordered Testing/Procedures: None ordered  Follow-Up: Your physician recommends that you schedule a follow-up appointment in: SEE DR. ALLRED 1ST AVAILABLE APPOINTMENT   Any Other Special Instructions Will Be Listed Below (If Applicable).   If you need a refill on your cardiac medications before your next appointment, please call your pharmacy.

## 2016-05-12 ENCOUNTER — Ambulatory Visit: Payer: Self-pay | Admitting: Podiatry

## 2016-05-15 ENCOUNTER — Encounter: Payer: Self-pay | Admitting: Vascular Surgery

## 2016-05-20 ENCOUNTER — Ambulatory Visit (INDEPENDENT_AMBULATORY_CARE_PROVIDER_SITE_OTHER): Payer: Medicare HMO | Admitting: Podiatry

## 2016-05-20 ENCOUNTER — Encounter: Payer: Self-pay | Admitting: Podiatry

## 2016-05-20 DIAGNOSIS — I739 Peripheral vascular disease, unspecified: Secondary | ICD-10-CM | POA: Diagnosis not present

## 2016-05-20 DIAGNOSIS — B351 Tinea unguium: Secondary | ICD-10-CM

## 2016-05-20 DIAGNOSIS — L600 Ingrowing nail: Secondary | ICD-10-CM

## 2016-05-20 NOTE — Progress Notes (Signed)
Marcular degeneration. Scheduled for eye surgery this month. 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  SUBJECTIVE: 83 y.o. year old female presents accompanied by her daughter for toe nail problem. Daughter stated that she noted bleeding nail yesterday and her mother has Dementia. Patient is S/P AKA right, wheel chair bound and cared by her daughter.   REVIEW OF SYSTEMS: A comprehensive review of systems was negative except for: S/P AKA right side in July 2017, Wheel chair bound, Afib, HTN, PVD, and has Dementia.   OBJECTIVE: DERMATOLOGIC EXAMINATION: Ingrown nail left great toe medial border without broken skin, edema or erythema. Nails are hypertrophic.  Thick dystrophic hallucal nail.  VASCULAR EXAMINATION OF LOWER LIMBS: All pedal pulses are not palpable both DP and PT left foot. Positive of mild pedal edema.  Temperature gradient from tibial crest to dorsum of foot is within normal bilateral.  NEUROLOGIC EXAMINATION OF THE LOWER LIMBS: Achilles DTR is present and within normal. Failed to respond to monofilament (Semmes-Weinstein 10-gm) sensory testing left foot.  Failed to respond to Vibratory sensations test left foot.   MUSCULOSKELETAL EXAMINATION: Contracted digits left foot.  ASSESSMENT: Ingrown nail great toe nail. Mycotic nail. PVD. S/P AKA right. Dementia. Wheelchair bound.  PLAN: Reviewed findings and available options with daughter. Reviewed using medicated shampoo for smelly foot. All nails debrided. Daughter wishes to bring her back in 63 month.

## 2016-05-20 NOTE — Patient Instructions (Signed)
Seen for possible ingrown nail on left great toe. No open skin or infection noted. Mild ingrown nail on left great toe at medial border. All nails debrided. May return in 3 months.

## 2016-05-21 ENCOUNTER — Encounter (INDEPENDENT_AMBULATORY_CARE_PROVIDER_SITE_OTHER): Payer: Self-pay

## 2016-05-21 ENCOUNTER — Ambulatory Visit (INDEPENDENT_AMBULATORY_CARE_PROVIDER_SITE_OTHER): Payer: Medicare HMO | Admitting: Vascular Surgery

## 2016-05-21 ENCOUNTER — Encounter: Payer: Self-pay | Admitting: Vascular Surgery

## 2016-05-21 ENCOUNTER — Encounter: Payer: Self-pay | Admitting: Family

## 2016-05-21 VITALS — BP 139/79 | HR 67 | Temp 97.0°F | Resp 18 | Ht 63.0 in | Wt 122.0 lb

## 2016-05-21 DIAGNOSIS — I739 Peripheral vascular disease, unspecified: Secondary | ICD-10-CM

## 2016-05-21 NOTE — Progress Notes (Signed)
Referring Physician: Dr Cecille Po  Patient name: Brooke Griffin MRN: [id number] DOB: [birth year] Sex: female  REASON FOR CONSULT: History of peripheral arterial disease with left leg swelling  HPI: Brooke Griffin is a 83 y.o. female who is status post right above-knee amputation about 6 months ago for a nonhealing wound. This was done at [facility]. The patient is being evaluated here for establishment of care and left leg swelling. She has a known history of peripheral arterial disease. She was deemed not to be a candidate for revascularization with a previous wound on the right leg and underwent an above-knee amputation. She has moderate to severe dementia. She has also previously had a stroke. She is fairly debilitated overall. She does however live at home with her daughter. She currently has no wounds on the left leg. She developed swelling in the left leg after being in her wheelchair all day. The daughter states she does try to elevate the leg but the patient refuses at times. She has a history of atrial fibrillation but is only on Plavix due to high fall risk.  Past Medical History:  Diagnosis Date  . Amiodarone    Amiodarone was started during hospitalization September, 2012  . Atrial fibrillation [facility])    Not a Coumadin candidate per the hospital record October, 2012,,, Chronic  /      September, 2012, rapid rate, controlled  . Chest pain    Nuclear, hospital, September, 2012, fixed inferolateral defect and compatible with remote infarct, EF 66%, no ischemia  . Cholecystitis    Laparoscopic cholecystectomy September 20 01  . CVA (cerebral infarction)    History of CVA  . Dementia   . Diverticulosis   . Ejection fraction    EF 55-60%, echo, November 18, 2010, hypokinesis of the basal inferior wall  . GI bleed   . Hematoma    Right flank hematoma / cellulitis  September, 2012  . Hypertension   . Pacemaker    Permanent pacemaker placed in the past.  Data to be  obtained  . Peripheral arterial disease ([place])   . Pleural effusion    Right pleural effusion, thoracentesis, October, 2012, for therapeutic reasons  . Sinoatrial node dysfunction (HCC)   . Stroke [facility])    Past Surgical History:  Procedure Laterality Date  . CHOLECYSTECTOMY  11/21/10  . hysterectomy - unknown type    . PACEMAKER INSERTION      Family History  Problem Relation Age of Onset  . Hypertension Mother   . Heart disease Mother     heart attack  . Heart disease    . Hypertension      SOCIAL HISTORY: Social History   Social History  . Marital status: Widowed    Spouse name: N/A  . Number of children: N/A  . Years of education: N/A   Occupational History  . Not on file.   Social History Main Topics  . Smoking status: Former Research scientist (life sciences)  . Smokeless tobacco: Never Used  . Alcohol use 0.0 oz/week     Comment: occasional  . Drug use: No  . Sexual activity: Not on file   Other Topics Concern  . Not on file   Social History Narrative  . No narrative on file    Allergies  Allergen Reactions  . Donepezil Nausea And Vomiting    Current Outpatient Prescriptions  Medication Sig Dispense Refill  . clopidogrel (PLAVIX) 75 MG tablet Take 1 tablet (75 mg total) by  mouth daily. 90 tablet 3  . digoxin (LANOXIN) 0.125 MG tablet Take 1 tablet (0.125 mg total) by mouth daily. 90 tablet 3  . metoprolol (LOPRESSOR) 50 MG tablet Take 1 tablet (50 mg total) by mouth 2 (two) times daily. 180 tablet 3  . mirtazapine (REMERON) 30 MG tablet Take 30 mg by mouth at bedtime.    . Multiple Vitamin (MULTI-VITAMINS) TABS Take 1 tablet by mouth daily.    . OXYCODONE ER PO Take 1 tablet by mouth daily as needed (for pain). Unsure of dosage.    . vitamin C (ASCORBIC ACID) 500 MG tablet Take 500 mg by mouth daily.     No current facility-administered medications for this visit.     ROS:   Unable to obtain secondary to patient's dementia  Physical Examination  Vitals:   05/21/16  1024  BP: 139/79  Pulse: 67  Resp: 18  Temp: 97 F (36.1 C)  TempSrc: Oral  SpO2: 97%  Weight: 122 lb (55.3 kg)  Height: 5\' 3"  (1.6 m)    Body mass index is 21.61 kg/m.  General:  Alert and oriented, no acute distress HEENT: Normal Neck: No bruit or JVD Pulmonary: Clear to auscultation bilaterally Cardiac: Regular Rate and Rhythm without murmur Abdomen: Soft, non-tender, non-distended, no mass Skin: No rash, no open wounds left foot well-healed right above-knee amputation Extremity Pulses:  1+ left femoral, absent left popliteal dorsalis pedis, posterior tibial pulse Musculoskeletal: Right above-knee amputation, 1-2+ edema left pretibial Neurologic: Upper and lower extremity motor 5/5 and symmetric  Data: I reviewed the patient's medical records from [facility] regional surrounding her previous amputation of her right leg.  ASSESSMENT:  Patient with known peripheral arterial disease. Left leg is currently asymptomatic other than some dependent edema.   PLAN:  I discussed with the patient's daughter today that if she develops a nonhealing wound on her left foot we could consider an intervention at some point future since the patient does use her left leg to assist with transfer. However, as long as she remains symptomatic no intervention is necessary. As far as the left leg is concerned I again advised her to elevate the leg as often as possible. I also gave her prescription today for a left lower extremity compression stockings 15-20 mm compression to assist with improving her leg swelling.  Chronic medical problems including her atrial fibrillation dementia and prior stroke are all currently stable.  The patient will follow-up with [place] in 1 year with a left leg ABI.   Ruta Hinds, MD [facility] Office: [phone] Pager: [phone]

## 2016-05-21 NOTE — Addendum Note (Signed)
Addended by: Lianne Cure A on: 05/21/2016 02:15 PM   Modules accepted: Orders

## 2016-05-26 ENCOUNTER — Encounter: Payer: Self-pay | Admitting: Internal Medicine

## 2016-06-10 ENCOUNTER — Encounter: Payer: Self-pay | Admitting: Internal Medicine

## 2016-06-10 ENCOUNTER — Ambulatory Visit (INDEPENDENT_AMBULATORY_CARE_PROVIDER_SITE_OTHER): Payer: Medicare HMO | Admitting: Internal Medicine

## 2016-06-10 VITALS — BP 132/82 | HR 68 | Ht 63.0 in | Wt 126.2 lb

## 2016-06-10 DIAGNOSIS — Z8673 Personal history of transient ischemic attack (TIA), and cerebral infarction without residual deficits: Secondary | ICD-10-CM | POA: Diagnosis not present

## 2016-06-10 DIAGNOSIS — Z950 Presence of cardiac pacemaker: Secondary | ICD-10-CM | POA: Diagnosis not present

## 2016-06-10 DIAGNOSIS — I482 Chronic atrial fibrillation, unspecified: Secondary | ICD-10-CM

## 2016-06-10 DIAGNOSIS — I10 Essential (primary) hypertension: Secondary | ICD-10-CM | POA: Diagnosis not present

## 2016-06-10 MED ORDER — APIXABAN 2.5 MG PO TABS: 2.5000 mg | ORAL_TABLET | Freq: Two times a day (BID) | ORAL | 3 refills | Status: DC

## 2016-06-10 NOTE — Patient Instructions (Addendum)
Medication Instructions:  Your physician has recommended you make the following change in your medication:  1) Stop Plavix 2) Start Eliquis 2.5 mg twice dialy   Labwork: Your physician recommends that you return for lab work today: dig level/BMP/CBC    Testing/Procedures: None ordered   Follow-Up: Your physician recommends that you schedule a follow-up appointment in: 6 weeks in the Anti-coag clinic for Eliquis follow up  Remote monitoring is used to monitor your Pacemaker from home. This monitoring reduces the number of office visits required to check your device to one time per year. It allows [place] to keep an eye on the functioning of your device to ensure it is working properly. You are scheduled for a device check from home on 09/09/16. You may send your transmission at any time that day. If you have a wireless device, the transmission will be sent automatically. After your physician reviews your transmission, you will receive a postcard with your next transmission date.   Your physician wants you to follow-up in: 41 months Chanetta Marshall, NP You will receive a reminder letter in the mail two months in advance. If you don't receive a letter, please call our office to schedule the follow-up appointment.      Any Other Special Instructions Will Be Listed Below (If Applicable).     If you need a refill on your cardiac medications before your next appointment, please call your pharmacy.

## 2016-06-10 NOTE — Progress Notes (Signed)
PCP: Philis Fendt, MD Primary Cardiologist:  Previously Dr Rayford Halsted is a 83 y.o. female who presents today to re-establish for electrophysiology followup.  Since last being seen in our clinic, the patient reports doing reasonably well.  She is s/p generator change in October in [place].  She is on plavix and has had some bruising with this.  Today, she denies symptoms of palpitations, chest pain, shortness of breath, lower extremity edema, dizziness, presyncope, or syncope.  The patient is otherwise without complaint today.   Past Medical History:  Diagnosis Date  . Amiodarone    Amiodarone was started during hospitalization September, 2012  . Bradycardia   . Chest pain    Nuclear, hospital, September, 2012, fixed inferolateral defect and compatible with remote infarct, EF 66%, no ischemia  . Cholecystitis    Laparoscopic cholecystectomy September 20 01  . Dementia   . Diverticulosis   . Ejection fraction    EF 55-60%, echo, November 18, 2010, hypokinesis of the basal inferior wall  . GI bleed   . Hematoma    Right flank hematoma / cellulitis  September, 2012  . Hypertension   . Peripheral arterial disease ([place])   . Permanent atrial fibrillation (HCC)    previously anticoagulated with plavix by Dr Ron Parker  . Pleural effusion    Right pleural effusion, thoracentesis, October, 2012, for therapeutic reasons  . Stroke [facility])    Past Surgical History:  Procedure Laterality Date  . CHOLECYSTECTOMY  11/21/10  . hysterectomy - unknown type    . PACEMAKER GENERATOR CHANGE Right 11/26/2015   Medtronic Adapta gen change by Dr Purvis Kilts  . PACEMAKER INSERTION      Current Outpatient Prescriptions  Medication Sig Dispense Refill  . digoxin (LANOXIN) 0.125 MG tablet Take 1 tablet (0.125 mg total) by mouth daily. 90 tablet 3  . metoprolol (LOPRESSOR) 50 MG tablet Take 1 tablet (50 mg total) by mouth 2 (two) times daily. 180 tablet 3  . mirtazapine (REMERON) 30 MG tablet Take 30  mg by mouth at bedtime.    . Multiple Vitamin (MULTI-VITAMINS) TABS Take 1 tablet by mouth daily.    . OXYCODONE ER PO Take 1 tablet by mouth daily as needed (for pain). Unsure of strength of tablet    . vitamin C (ASCORBIC ACID) 500 MG tablet Take 500 mg by mouth daily.    Marland Kitchen apixaban (ELIQUIS) 2.5 MG TABS tablet Take 1 tablet (2.5 mg total) by mouth 2 (two) times daily. 180 tablet 3   No current facility-administered medications for this visit.     Physical Exam: Vitals:   06/10/16 1141  BP: 132/82  Pulse: 68  SpO2: 98%  Weight: 126 lb 3.2 oz (57.2 kg)  Height: 5\' 3"  (1.6 m)    GEN- The patient is elderly appearing, alert  Head- normocephalic, atraumatic Eyes-  Sclera clear, conjunctiva pink Ears- hearing intact Oropharynx- clear Lungs- Clear to ausculation bilaterally, normal work of breathing Chest- pacemaker pocket is well healed Heart- Regular rate and rhythm (paced) GI- soft, NT, ND, + BS Extremities- no clubbing, cyanosis, or edema  Pacemaker interrogation- personally reviewed in detail today,  See PACEART report  Assessment and Plan:  1.   bradycardia Normal pacemaker function See Pace Art report No changes today  2. Permanent atrial fibrillation Rate controlled.  Check dig level today to make sure she is on the correct dose. chads2vasc score is at least 6.  Previously on plavix for stroke prevention. Today,  I discussed Coumadin as well as novel anticoagulants including Pradaxa, Xarelto, Savaysa, and Eliquis today as indicated for risk reduction in stroke and systemic emboli with nonvalvular atrial fibrillation.  Risks, benefits, and alternatives to each of these drugs were discussed at length today.  The patient and her daughter would like to try eliquis. Stop plavix.  Cbc, bmet today Start eliquis 2.5mg  BID  3. htn Stable No change required today  Return to anticoagulation clinic in 6 weeks to make sure that she is doing well with  anticoagulation  carelink Return to see EP NP  In 1 year  Thompson Grayer MD, [facility] 06/10/2016 2:24 PM

## 2016-06-11 LAB — CBC WITH DIFFERENTIAL/PLATELET
BASOS: 0 %
Basophils Absolute: 0 10*3/uL (ref 0.0–0.2)
EOS (ABSOLUTE): 0.1 10*3/uL (ref 0.0–0.4)
EOS: 2 %
HEMOGLOBIN: 12.7 g/dL (ref 11.1–15.9)
Hematocrit: 35.4 % (ref 34.0–46.6)
IMMATURE GRANULOCYTES: 0 %
Immature Grans (Abs): 0 10*3/uL (ref 0.0–0.1)
LYMPHS ABS: 1.9 10*3/uL (ref 0.7–3.1)
Lymphs: 32 %
MCH: 31.8 pg (ref 26.6–33.0)
MCHC: 35.9 g/dL — ABNORMAL HIGH (ref 31.5–35.7)
MCV: 89 fL (ref 79–97)
MONOS ABS: 0.6 10*3/uL (ref 0.1–0.9)
Monocytes: 10 %
Neutrophils Absolute: 3.4 10*3/uL (ref 1.4–7.0)
Neutrophils: 56 %
Platelets: 208 10*3/uL (ref 150–379)
RBC: 4 x10E6/uL (ref 3.77–5.28)
RDW: 15.2 % (ref 12.3–15.4)
WBC: 6.1 10*3/uL (ref 3.4–10.8)

## 2016-06-11 LAB — BASIC METABOLIC PANEL
BUN / CREAT RATIO: 16 (ref 12–28)
BUN: 19 mg/dL (ref 8–27)
CO2: 24 mmol/L (ref 18–29)
CREATININE: 1.17 mg/dL — AB (ref 0.57–1.00)
Calcium: 9.3 mg/dL (ref 8.7–10.3)
Chloride: 104 mmol/L (ref 96–106)
GFR, EST AFRICAN AMERICAN: 48 mL/min/{1.73_m2} — AB (ref >59)
GFR, EST NON AFRICAN AMERICAN: 42 mL/min/{1.73_m2} — AB (ref >59)
Glucose: 82 mg/dL (ref 65–99)
Potassium: 4.7 mmol/L (ref 3.5–5.2)
SODIUM: 143 mmol/L (ref 134–144)

## 2016-06-11 LAB — SPECIMEN STATUS

## 2016-06-11 LAB — DIGOXIN LEVEL: Digoxin, Serum: 1.4 ng/mL — ABNORMAL HIGH (ref 0.5–0.9)

## 2016-06-12 LAB — CUP PACEART INCLINIC DEVICE CHECK
Battery Impedance: 201 Ohm
Battery Remaining Longevity: 112 mo
Brady Statistic RV Percent Paced: 46 %
Implantable Lead Implant Date: 19960328
Implantable Lead Location: 753860
Implantable Pulse Generator Implant Date: 20171003
Lead Channel Pacing Threshold Pulse Width: 0.4 ms
Lead Channel Setting Pacing Amplitude: 2 V
Lead Channel Setting Pacing Pulse Width: 0.4 ms
MDC IDC MSMT BATTERY VOLTAGE: 2.79 V
MDC IDC MSMT LEADCHNL RA IMPEDANCE VALUE: 0 Ohm
MDC IDC MSMT LEADCHNL RV IMPEDANCE VALUE: 732 Ohm
MDC IDC MSMT LEADCHNL RV PACING THRESHOLD AMPLITUDE: 0.75 V
MDC IDC MSMT LEADCHNL RV SENSING INTR AMPL: 4 mV
MDC IDC SESS DTM: 20180418154352
MDC IDC SET LEADCHNL RV SENSING SENSITIVITY: 2 mV

## 2016-07-02 ENCOUNTER — Other Ambulatory Visit: Payer: Self-pay

## 2016-07-02 MED ORDER — DIGOXIN 125 MCG PO TABS: 0.1250 mg | ORAL_TABLET | ORAL | 3 refills | Status: AC

## 2016-07-02 NOTE — Telephone Encounter (Signed)
Patients daughter calling in to have digoxin refilled for her mother. States that patient had OV with Dr. Rayann Heman and per OV Dr. Rayann Heman stated he would not refill digoxin until he got labs back to decide whether dosage needed to be titrated. Patient's daughter states no one has called her with her mother lab results yet and she only has 3 days left of her current RX for digoxin. I told her I would forward information to Dr. Rayann Heman and Nurse for advising and someone would call her back. She is agreeable to plan.

## 2016-07-02 NOTE — Telephone Encounter (Signed)
See result note-rx sent to pharmacy-daughter aware.

## 2016-07-08 ENCOUNTER — Telehealth: Payer: Self-pay

## 2016-07-08 DIAGNOSIS — I4891 Unspecified atrial fibrillation: Secondary | ICD-10-CM

## 2016-07-08 NOTE — Telephone Encounter (Signed)
Patient is coming in for repeat blood work per Dr. Rayann Heman.

## 2016-07-10 ENCOUNTER — Other Ambulatory Visit: Payer: Medicare HMO

## 2016-08-20 ENCOUNTER — Ambulatory Visit: Payer: Medicare HMO | Admitting: Podiatry

## 2016-09-09 ENCOUNTER — Ambulatory Visit (INDEPENDENT_AMBULATORY_CARE_PROVIDER_SITE_OTHER): Payer: Medicare HMO | Admitting: *Deleted

## 2016-09-09 ENCOUNTER — Telehealth: Payer: Self-pay | Admitting: Cardiology

## 2016-09-09 DIAGNOSIS — I495 Sick sinus syndrome: Secondary | ICD-10-CM | POA: Diagnosis not present

## 2016-09-09 NOTE — Progress Notes (Signed)
Remote pacemaker transmission.   

## 2016-09-09 NOTE — Telephone Encounter (Signed)
Confirmed remote transmission w/ pt daughter.   

## 2016-09-10 ENCOUNTER — Encounter: Payer: Self-pay | Admitting: Cardiology

## 2016-09-10 LAB — CUP PACEART REMOTE DEVICE CHECK
Date Time Interrogation Session: 20180718163443
Implantable Lead Implant Date: 19960328
Implantable Lead Location: 753860
Implantable Pulse Generator Implant Date: 20171003
Lead Channel Impedance Value: 0 Ohm
Lead Channel Impedance Value: 683 Ohm
Lead Channel Pacing Threshold Pulse Width: 0.4 ms
MDC IDC MSMT BATTERY IMPEDANCE: 201 Ohm
MDC IDC MSMT BATTERY REMAINING LONGEVITY: 110 mo
MDC IDC MSMT BATTERY VOLTAGE: 2.79 V
MDC IDC MSMT LEADCHNL RV PACING THRESHOLD AMPLITUDE: 0.625 V
MDC IDC SET LEADCHNL RV PACING AMPLITUDE: 2 V
MDC IDC SET LEADCHNL RV PACING PULSEWIDTH: 0.4 ms
MDC IDC SET LEADCHNL RV SENSING SENSITIVITY: 2 mV
MDC IDC STAT BRADY RV PERCENT PACED: 65 %

## 2016-12-09 ENCOUNTER — Ambulatory Visit (INDEPENDENT_AMBULATORY_CARE_PROVIDER_SITE_OTHER): Payer: Medicare (Managed Care) | Admitting: *Deleted

## 2016-12-09 ENCOUNTER — Telehealth: Payer: Self-pay | Admitting: Cardiology

## 2016-12-09 DIAGNOSIS — I495 Sick sinus syndrome: Secondary | ICD-10-CM

## 2016-12-09 NOTE — Telephone Encounter (Signed)
Confirmed remote transmission w/ pt daughter.   

## 2016-12-10 NOTE — Progress Notes (Signed)
Remote pacemaker transmission.   

## 2016-12-11 ENCOUNTER — Encounter: Payer: Self-pay | Admitting: Cardiology

## 2016-12-11 NOTE — Progress Notes (Signed)
Letter  

## 2017-01-01 LAB — CUP PACEART REMOTE DEVICE CHECK
Battery Remaining Longevity: 112 mo
Battery Voltage: 2.79 V
Brady Statistic RV Percent Paced: 58 %
Implantable Lead Implant Date: 19960328
Implantable Lead Location: 753860
Lead Channel Impedance Value: 0 Ohm
Lead Channel Pacing Threshold Pulse Width: 0.4 ms
Lead Channel Setting Pacing Amplitude: 2 V
Lead Channel Setting Pacing Pulse Width: 0.4 ms
MDC IDC MSMT BATTERY IMPEDANCE: 201 Ohm
MDC IDC MSMT LEADCHNL RV IMPEDANCE VALUE: 602 Ohm
MDC IDC MSMT LEADCHNL RV PACING THRESHOLD AMPLITUDE: 0.625 V
MDC IDC PG IMPLANT DT: 20171003
MDC IDC SESS DTM: 20181017135309
MDC IDC SET LEADCHNL RV SENSING SENSITIVITY: 2 mV

## 2017-03-01 ENCOUNTER — Ambulatory Visit (INDEPENDENT_AMBULATORY_CARE_PROVIDER_SITE_OTHER): Payer: Medicare (Managed Care) | Admitting: Internal Medicine

## 2017-03-01 ENCOUNTER — Encounter: Payer: Self-pay | Admitting: Internal Medicine

## 2017-03-01 VITALS — BP 116/68 | HR 65

## 2017-03-01 DIAGNOSIS — Z45018 Encounter for adjustment and management of other part of cardiac pacemaker: Secondary | ICD-10-CM | POA: Diagnosis not present

## 2017-03-01 DIAGNOSIS — I10 Essential (primary) hypertension: Secondary | ICD-10-CM

## 2017-03-01 DIAGNOSIS — I495 Sick sinus syndrome: Secondary | ICD-10-CM | POA: Diagnosis not present

## 2017-03-01 DIAGNOSIS — I482 Chronic atrial fibrillation, unspecified: Secondary | ICD-10-CM

## 2017-03-01 LAB — CUP PACEART INCLINIC DEVICE CHECK
Battery Remaining Longevity: 113 mo
Date Time Interrogation Session: 20190107143601
Implantable Lead Location: 753860
Implantable Pulse Generator Implant Date: 20171003
Lead Channel Pacing Threshold Amplitude: 0.625 V
Lead Channel Pacing Threshold Pulse Width: 0.4 ms
Lead Channel Pacing Threshold Pulse Width: 0.4 ms
Lead Channel Setting Pacing Pulse Width: 0.4 ms
Lead Channel Setting Sensing Sensitivity: 2 mV
MDC IDC LEAD IMPLANT DT: 19960328
MDC IDC MSMT BATTERY IMPEDANCE: 201 Ohm
MDC IDC MSMT BATTERY VOLTAGE: 2.79 V
MDC IDC MSMT LEADCHNL RA IMPEDANCE VALUE: 0 Ohm
MDC IDC MSMT LEADCHNL RV IMPEDANCE VALUE: 558 Ohm
MDC IDC MSMT LEADCHNL RV PACING THRESHOLD AMPLITUDE: 0.75 V
MDC IDC MSMT LEADCHNL RV SENSING INTR AMPL: 2.8 mV
MDC IDC SET LEADCHNL RV PACING AMPLITUDE: 2 V
MDC IDC STAT BRADY RV PERCENT PACED: 49 %

## 2017-03-01 NOTE — Patient Instructions (Signed)
Medication Instructions:  Your physician recommends that you continue on your current medications as directed. Please refer to the Current Medication list given to you today.  *If you need a refill on your cardiac medications before your next appointment, please call your pharmacy*  Labwork: None ordered  Testing/Procedures: None ordered  Follow-Up:  Remote monitoring is used to monitor your Pacemaker of ICD from home. This monitoring reduces the number of office visits required to check your device to one time per year. It allows [place] to keep an eye on the functioning of your device to ensure it is working properly. You are scheduled for a device check from home on 03/10/2017. You may send your transmission at any time that day. If you have a wireless device, the transmission will be sent automatically. After your physician reviews your transmission, you will receive a postcard with your next transmission date.  Your physician wants you to follow-up in: 1 year with Tommye Standard, PA.  You will receive a reminder letter in the mail two months in advance. If you don't receive a letter, please call our office to schedule the follow-up appointment.  Thank you for choosing [facility]!!     Any Other Special Instructions Will Be Listed Below (If Applicable).

## 2017-03-01 NOTE — Progress Notes (Signed)
PCP: Nolene Ebbs, MD Primary Cardiologist:  Previously Dr Ron Parker Primary EP:  Dr Rayann Heman  Brooke Griffin is a 84 y.o. female who presents today for routine electrophysiology followup.  Since last being seen in our clinic, the patient reports doing reasonably well.   She has advanced dementia.   No concerns are noted by her daughter.  Past Medical History:  Diagnosis Date  . Amiodarone    Amiodarone was started during hospitalization September, 2012  . Bradycardia   . Chest pain    Nuclear, hospital, September, 2012, fixed inferolateral defect and compatible with remote infarct, EF 66%, no ischemia  . Cholecystitis    Laparoscopic cholecystectomy September 20 01  . Dementia   . Diverticulosis   . Ejection fraction    EF 55-60%, echo, November 18, 2010, hypokinesis of the basal inferior wall  . GI bleed   . Hematoma    Right flank hematoma / cellulitis  September, 2012  . Hypertension   . Peripheral arterial disease ([place])   . Permanent atrial fibrillation (HCC)    previously anticoagulated with plavix by Dr Ron Parker  . Pleural effusion    Right pleural effusion, thoracentesis, October, 2012, for therapeutic reasons  . Stroke [facility])    Past Surgical History:  Procedure Laterality Date  . CHOLECYSTECTOMY  11/21/10  . hysterectomy - unknown type    . PACEMAKER GENERATOR CHANGE Right 11/26/2015   Medtronic Adapta gen change by Dr Purvis Kilts  . PACEMAKER INSERTION      ROS- all systems are reviewed and negative except as per HPI above  Current Outpatient Medications  Medication Sig Dispense Refill  . acetaminophen (TYLENOL) 500 MG tablet Take 1,000 mg by mouth every 6 (six) hours as needed for mild pain or moderate pain.    Marland Kitchen apixaban (ELIQUIS) 2.5 MG TABS tablet Take 1 tablet (2.5 mg total) by mouth 2 (two) times daily. 180 tablet 3  . digoxin (LANOXIN) 0.125 MG tablet Take 1 tablet (0.125 mg total) by mouth every other day. 45 tablet 3  . ergocalciferol (VITAMIN D2) 50000 units  capsule Take 50,000 Units by mouth once a week.    . escitalopram (LEXAPRO) 10 MG tablet Take 10 mg by mouth at bedtime.    . fluticasone (FLONASE) 50 MCG/ACT nasal spray Place 2 sprays into both nostrils daily.    . metoprolol (LOPRESSOR) 50 MG tablet Take 1 tablet (50 mg total) by mouth 2 (two) times daily. 180 tablet 3  . mirtazapine (REMERON) 15 MG tablet Take 15 mg by mouth at bedtime.     No current facility-administered medications for this visit.     Physical Exam: Vitals:   03/01/17 1232  BP: 116/68  Pulse: 68    GEN- The patient is well appearing, alert + dementia  Head- normocephalic, atraumatic Eyes-  Sclera clear, conjunctiva pink Ears- hearing intact Oropharynx- clear Lungs- Clear to ausculation bilaterally, normal work of breathing Chest- pacemaker pocket is well healed Heart- irregular rate and rhythm  GI- soft, NT, ND, + BS Extremities- no clubbing, cyanosis, or edema,  S/p R AKA  Pacemaker interrogation- reviewed in detail today,  See PACEART report    Assessment and Plan:  1. Symptomatic bradycardia  Normal pacemaker function See Pace Art report No changes today  2. Permanent atrial fibrillation Rate controlled On eliquis No changes  3. HTN Stable No change required today  carelink Return to see EP PA annually  Thompson Grayer MD, [facility] 03/01/2017 12:37 PM

## 2017-03-10 ENCOUNTER — Telehealth: Payer: Self-pay | Admitting: Cardiology

## 2017-03-10 ENCOUNTER — Encounter: Payer: Medicaid Other | Admitting: *Deleted

## 2017-03-10 NOTE — Telephone Encounter (Signed)
Confirmed remote transmission w/ pt daughter.   

## 2017-03-11 ENCOUNTER — Encounter: Payer: Self-pay | Admitting: Cardiology

## 2017-05-27 ENCOUNTER — Encounter (HOSPITAL_COMMUNITY): Payer: Medicare HMO

## 2017-05-27 ENCOUNTER — Ambulatory Visit: Payer: Medicare HMO | Admitting: Family

## 2017-05-31 ENCOUNTER — Ambulatory Visit (HOSPITAL_COMMUNITY)
Admission: RE | Admit: 2017-05-31 | Discharge: 2017-05-31 | Disposition: A | Discharge status: Home / Self Care | Payer: Medicare (Managed Care) | Source: Ambulatory Visit | Attending: Vascular Surgery | Admitting: Vascular Surgery

## 2017-05-31 ENCOUNTER — Ambulatory Visit (INDEPENDENT_AMBULATORY_CARE_PROVIDER_SITE_OTHER): Payer: Medicare (Managed Care) | Admitting: Family

## 2017-05-31 ENCOUNTER — Encounter: Payer: Self-pay | Admitting: Family

## 2017-05-31 VITALS — BP 105/61 | HR 80 | Temp 97.2°F | Resp 18 | Ht 62.0 in | Wt 136.0 lb

## 2017-05-31 DIAGNOSIS — I739 Peripheral vascular disease, unspecified: Secondary | ICD-10-CM | POA: Insufficient documentation

## 2017-05-31 DIAGNOSIS — Z89511 Acquired absence of right leg below knee: Secondary | ICD-10-CM | POA: Insufficient documentation

## 2017-05-31 NOTE — Patient Instructions (Signed)
  To decrease swelling in your foot and leg: Elevate feet above slightly bent knees, feet above heart, overnight and 3-4 times per day for 20 minutes.    Peripheral Vascular Disease Peripheral vascular disease (PVD) is a disease of the blood vessels that are not part of your heart and brain. A simple term for PVD is poor circulation. In most cases, PVD narrows the blood vessels that carry blood from your heart to the rest of your body. This can result in a decreased supply of blood to your arms, legs, and internal organs, like your stomach or kidneys. However, it most often affects a person's lower legs and feet. There are two types of PVD.  Organic PVD. This is the more common type. It is caused by damage to the structure of blood vessels.  Functional PVD. This is caused by conditions that make blood vessels contract and tighten (spasm).  Without treatment, PVD tends to get worse over time. PVD can also lead to acute ischemic limb. This is when an arm or limb suddenly has trouble getting enough blood. This is a medical emergency. Follow these instructions at home:  Take medicines only as told by your doctor.  Do not use any tobacco products, including cigarettes, chewing tobacco, or electronic cigarettes. If you need help quitting, ask your doctor.  Lose weight if you are overweight, and maintain a healthy weight as told by your doctor.  Eat a diet that is low in fat and cholesterol. If you need help, ask your doctor.  Exercise regularly. Ask your doctor for some good activities for you.  Take good care of your feet. ? Wear comfortable shoes that fit well. ? Check your feet often for any cuts or sores. Contact a doctor if:  You have cramps in your legs while walking.  You have leg pain when you are at rest.  You have coldness in a leg or foot.  Your skin changes.  You are unable to get or have an erection (erectile dysfunction).  You have cuts or sores on your feet that are  not healing. Get help right away if:  Your arm or leg turns cold and blue.  Your arms or legs become red, warm, swollen, painful, or numb.  You have chest pain or trouble breathing.  You suddenly have weakness in your face, arm, or leg.  You become very confused or you cannot speak.  You suddenly have a very bad headache.  You suddenly cannot see. This information is not intended to replace advice given to you by your health care provider. Make sure you discuss any questions you have with your health care provider. Document Released: 05/06/2009 Document Revised: 07/18/2015 Document Reviewed: 07/20/2013 Elsevier Interactive Patient Education  2017 [employer]  

## 2017-05-31 NOTE — Progress Notes (Signed)
[facility] [place]   CC: Follow up peripheral artery occlusive disease  History of Present Illness Brooke Griffin is a 84 y.o. female who is status post right above-knee amputation about September 2017 for a nonhealing wound. This was done at [facility].   The patient is being evaluated here for establishment of care and left leg swelling. She has a known history of peripheral arterial disease. She was deemed not to be a candidate for revascularization with a previous wound on the right leg and underwent an above-knee amputation. She has moderate to severe dementia.  She has also previously had a stroke. She is fairly debilitated overall. She does however live at home with her daughter. She currently has no wounds on the left leg. She developed swelling in the left leg after being in her wheelchair all day. The daughter states she does try to elevate the leg but the patient refuses at times. She has a history of atrial fibrillation but ws only on Plavix due to high fall risk, is taking Eliquis currently.   Dr. Oneida Alar last evaluated pt on 05-21-16. At that time he discussed with the patient's daughter that if she develops a nonhealing wound on her left foot we could consider an intervention at some point future since the patient does use her left leg to assist with transfer. However, as long as she remains symptomatic no intervention is necessary. As far as the left leg is concerned Dr. Oneida Alar again advised her to elevate the leg as often as possible. Dr. Oneida Alar also gave her prescription for a left lower extremity compression stockings 15-20 mm compression to assist with improving her leg swelling. Chronic medical problems including her atrial fibrillation dementia and prior stroke were all stable. The patient was to follow-up with [place] in 1 year with a left leg ABI.  Pt is at the [facility] program during the day.  Daughter states a personal care aid visits ot for 2  hours every morning.     Diabetic: No Tobacco use: former smoker  Pt meds include: Statin :No Betablocker: Yes ASA: No Other anticoagulants/antiplatelets: Eliquis, hx of atrial fib  Past Medical History:  Diagnosis Date  . Amiodarone    Amiodarone was started during hospitalization September, 2012  . Bradycardia   . Chest pain    Nuclear, hospital, September, 2012, fixed inferolateral defect and compatible with remote infarct, EF 66%, no ischemia  . Cholecystitis    Laparoscopic cholecystectomy September 20 01  . Dementia   . Diverticulosis   . Ejection fraction    EF 55-60%, echo, November 18, 2010, hypokinesis of the basal inferior wall  . GI bleed   . Hematoma    Right flank hematoma / cellulitis  September, 2012  . Hypertension   . Peripheral arterial disease ([place])   . Permanent atrial fibrillation (HCC)    previously anticoagulated with plavix by Dr Ron Parker  . Pleural effusion    Right pleural effusion, thoracentesis, October, 2012, for therapeutic reasons  . Stroke [facility])     Social History Social History   Tobacco Use  . Smoking status: Former Research scientist (life sciences)  . Smokeless tobacco: Never Used  Substance Use Topics  . Alcohol use: Yes    Alcohol/week: 0.0 oz    Comment: occasional  . Drug use: No    Family History Family History  Problem Relation Age of Onset  . Hypertension Mother   . Heart disease Mother  heart attack  . Heart disease Unknown   . Hypertension Unknown     Past Surgical History:  Procedure Laterality Date  . CHOLECYSTECTOMY  11/21/10  . hysterectomy - unknown type    . PACEMAKER GENERATOR CHANGE Right 11/26/2015   [employer] Adapta gen change by Dr Purvis Kilts  . PACEMAKER INSERTION      Allergies  Allergen Reactions  . Donepezil Nausea And Vomiting    Current Outpatient Medications  Medication Sig Dispense Refill  . acetaminophen (TYLENOL) 500 MG tablet Take 1,000 mg by mouth every 6 (six) hours as needed for mild pain or moderate  pain.    Marland Kitchen apixaban (ELIQUIS) 2.5 MG TABS tablet Take 1 tablet (2.5 mg total) by mouth 2 (two) times daily. 180 tablet 3  . digoxin (LANOXIN) 0.125 MG tablet Take 1 tablet (0.125 mg total) by mouth every other day. 45 tablet 3  . ergocalciferol (VITAMIN D2) 50000 units capsule Take 50,000 Units by mouth once a week.    . escitalopram (LEXAPRO) 10 MG tablet Take 10 mg by mouth at bedtime.    . fluticasone (FLONASE) 50 MCG/ACT nasal spray Place 2 sprays into both nostrils daily.    . metoprolol (LOPRESSOR) 50 MG tablet Take 1 tablet (50 mg total) by mouth 2 (two) times daily. 180 tablet 3  . mirtazapine (REMERON) 15 MG tablet Take 15 mg by mouth at bedtime.    . Multiple Vitamins-Minerals (ICAPS AREDS FORMULA PO) Take by mouth.    . zinc oxide (BALMEX) 11.3 % CREA cream Apply 1 application topically 2 (two) times daily.     No current facility-administered medications for this visit.     ROS: See HPI for pertinent positives and negatives.   Physical Examination  Vitals:   05/31/17 1149 05/31/17 1153  BP: (!) 102/58 105/61  Pulse: 80   Resp: 18   Temp: (!) 97.2 F (36.2 C)   TempSrc: Oral   SpO2: 96%   Weight: 136 lb (61.7 kg)   Height: 5\' 2"  (1.575 m)    Body mass index is 24.87 kg/m.  General: A&O x 3, WDWN, female. Gait: seated in w/c HENT: No gross abnormalities.  Eyes: PERRLA. Pulmonary: Respirations are non labored, CTAB, good air movement Cardiac: regular rhythm, no detected murmur.         Carotid Bruits Right Left   Negative Negative   Radial pulses are 2+ palpable bilaterally   Adominal aortic pulse is not palpable                         VASCULAR EXAM: Extremities without ischemic changes, without Gangrene; without open wounds. Right AKA.                                                                                                           LE Pulses Right Left       FEMORAL  not palpable seated in w/c  not palpable        POPLITEAL  AKA palpable  not palpable       POSTERIOR TIBIAL  AKA   not palpable        DORSALIS PEDIS      ANTERIOR TIBIAL AKA  not palpable    Abdomen: soft, NT, no palpable masses. Skin: no rashes, no cellulitis, no ulcers noted. Musculoskeletal: no muscle wasting or atrophy. See Extremities.   Neurologic: A&O X 2; appropriate affect, Sensation is normal; MOTOR FUNCTION:  moving all extremities equally, motor strength 5/5 throughout. Speech is fluent/normal. CN 2-12 intact. Psychiatric: Thought content is normal, mood appropriate for clinical situation.    ASSESSMENT: Brooke Griffin is a 84 y.o. female who is status post right above-knee amputation about September 2017 for a nonhealing wound. This was done at [facility].   Daughter is concerned that pt's left heel has been pink, like her right heel was before the right BKA. There is mild callus formation on the posterior aspect of pt's left heel, no evidence of skin breakdown, no evidence of ischemia in left foot or leg. But since pt is non ambulatory, has dementia, and has some mild non pitting dependent edema in her left foot, advised elevation of left foot and keeping pressure off left heel and ankles; I ordered soft foot protector bootie, daughter states [facility] will obtain since [facility] is in the [facility] program.   DATA  ABI (Date: 05/31/2017):  R: AKA   L:   ABI: 0.71 (no previous),   PT: waveform morphology not documented.   DP: waveform morphology not documented.   TBI: PPG tracings appear absent   PLAN:  Based on the patient's vascular studies and examination, pt will return to clinic in 1 year with left ABI and TBI.  I advised daughter to notify us if she develops concerns re the circulation in pt lower extremities.   I discussed in depth with the patient the nature of atherosclerosis, and emphasized the importance of maximal medical management including strict control of blood pressure, blood glucose, and lipid levels,  obtaining regular exercise, and continued cessation of smoking.  The patient is aware that without maximal medical management the underlying atherosclerotic disease process will progress, limiting the benefit of any interventions.  The patient was given information about PAD including signs, symptoms, treatment, what symptoms should prompt the patient to seek immediate medical care, and risk reduction measures to take.  Clemon Chambers, RN, MSN, FNP-C [facility] Phone: [phone]  Clinic MD: Trula Slade  05/31/17 12:08 PM

## 2017-08-13 ENCOUNTER — Encounter: Payer: Self-pay | Admitting: Cardiology

## 2017-09-01 ENCOUNTER — Encounter (HOSPITAL_COMMUNITY): Payer: Self-pay | Admitting: *Deleted

## 2017-09-01 ENCOUNTER — Other Ambulatory Visit: Payer: Self-pay

## 2017-09-01 ENCOUNTER — Inpatient Hospital Stay (HOSPITAL_COMMUNITY)
Admission: EM | Admit: 2017-09-01 | Discharge: 2017-09-03 | DRG: 812 | Disposition: A | Discharge status: Home / Self Care | Payer: Medicare (Managed Care) | Source: Ambulatory Visit | Attending: Internal Medicine | Admitting: Internal Medicine

## 2017-09-01 DIAGNOSIS — R1310 Dysphagia, unspecified: Secondary | ICD-10-CM | POA: Diagnosis present

## 2017-09-01 DIAGNOSIS — Z950 Presence of cardiac pacemaker: Secondary | ICD-10-CM

## 2017-09-01 DIAGNOSIS — I739 Peripheral vascular disease, unspecified: Secondary | ICD-10-CM | POA: Diagnosis present

## 2017-09-01 DIAGNOSIS — D649 Anemia, unspecified: Principal | ICD-10-CM | POA: Diagnosis present

## 2017-09-01 DIAGNOSIS — Z86010 Personal history of colonic polyps: Secondary | ICD-10-CM | POA: Diagnosis not present

## 2017-09-01 DIAGNOSIS — M419 Scoliosis, unspecified: Secondary | ICD-10-CM | POA: Diagnosis present

## 2017-09-01 DIAGNOSIS — F0390 Unspecified dementia without behavioral disturbance: Secondary | ICD-10-CM | POA: Diagnosis present

## 2017-09-01 DIAGNOSIS — Z66 Do not resuscitate: Secondary | ICD-10-CM | POA: Diagnosis present

## 2017-09-01 DIAGNOSIS — Z8673 Personal history of transient ischemic attack (TIA), and cerebral infarction without residual deficits: Secondary | ICD-10-CM | POA: Diagnosis not present

## 2017-09-01 DIAGNOSIS — K921 Melena: Secondary | ICD-10-CM | POA: Diagnosis present

## 2017-09-01 DIAGNOSIS — Z9889 Other specified postprocedural states: Secondary | ICD-10-CM | POA: Diagnosis not present

## 2017-09-01 DIAGNOSIS — Z89611 Acquired absence of right leg above knee: Secondary | ICD-10-CM

## 2017-09-01 DIAGNOSIS — Z8249 Family history of ischemic heart disease and other diseases of the circulatory system: Secondary | ICD-10-CM

## 2017-09-01 DIAGNOSIS — M810 Age-related osteoporosis without current pathological fracture: Secondary | ICD-10-CM | POA: Diagnosis present

## 2017-09-01 DIAGNOSIS — Z823 Family history of stroke: Secondary | ICD-10-CM

## 2017-09-01 DIAGNOSIS — Z90710 Acquired absence of both cervix and uterus: Secondary | ICD-10-CM | POA: Diagnosis not present

## 2017-09-01 DIAGNOSIS — R600 Localized edema: Secondary | ICD-10-CM | POA: Diagnosis not present

## 2017-09-01 DIAGNOSIS — K5730 Diverticulosis of large intestine without perforation or abscess without bleeding: Secondary | ICD-10-CM | POA: Diagnosis not present

## 2017-09-01 DIAGNOSIS — I482 Chronic atrial fibrillation: Secondary | ICD-10-CM | POA: Diagnosis present

## 2017-09-01 DIAGNOSIS — I4891 Unspecified atrial fibrillation: Secondary | ICD-10-CM | POA: Diagnosis not present

## 2017-09-01 DIAGNOSIS — Z7901 Long term (current) use of anticoagulants: Secondary | ICD-10-CM

## 2017-09-01 DIAGNOSIS — Z7951 Long term (current) use of inhaled steroids: Secondary | ICD-10-CM

## 2017-09-01 DIAGNOSIS — M4800 Spinal stenosis, site unspecified: Secondary | ICD-10-CM | POA: Diagnosis present

## 2017-09-01 DIAGNOSIS — I10 Essential (primary) hypertension: Secondary | ICD-10-CM | POA: Diagnosis present

## 2017-09-01 DIAGNOSIS — R195 Other fecal abnormalities: Secondary | ICD-10-CM | POA: Diagnosis not present

## 2017-09-01 DIAGNOSIS — Z9049 Acquired absence of other specified parts of digestive tract: Secondary | ICD-10-CM

## 2017-09-01 DIAGNOSIS — Z87891 Personal history of nicotine dependence: Secondary | ICD-10-CM

## 2017-09-01 DIAGNOSIS — E876 Hypokalemia: Secondary | ICD-10-CM | POA: Diagnosis present

## 2017-09-01 DIAGNOSIS — Z9289 Personal history of other medical treatment: Secondary | ICD-10-CM

## 2017-09-01 HISTORY — DX: Personal history of other medical treatment: Z92.89

## 2017-09-01 HISTORY — DX: Anemia, unspecified: D64.9

## 2017-09-01 LAB — COMPREHENSIVE METABOLIC PANEL
ALT: 12 U/L (ref 0–44)
AST: 22 U/L (ref 15–41)
Albumin: 2.2 g/dL — ABNORMAL LOW (ref 3.5–5.0)
Alkaline Phosphatase: 59 U/L (ref 38–126)
Anion gap: 5 (ref 5–15)
BUN: 11 mg/dL (ref 8–23)
CHLORIDE: 116 mmol/L — AB (ref 98–111)
CO2: 26 mmol/L (ref 22–32)
CREATININE: 0.87 mg/dL (ref 0.44–1.00)
Calcium: 8 mg/dL — ABNORMAL LOW (ref 8.9–10.3)
GFR calc Af Amer: >60 mL/min (ref >60)
GFR, EST NON AFRICAN AMERICAN: 57 mL/min — AB (ref >60)
Glucose, Bld: 141 mg/dL — ABNORMAL HIGH (ref 70–99)
Potassium: 3.2 mmol/L — ABNORMAL LOW (ref 3.5–5.1)
SODIUM: 147 mmol/L — AB (ref 135–145)
Total Bilirubin: 0.7 mg/dL (ref 0.3–1.2)
Total Protein: 4.8 g/dL — ABNORMAL LOW (ref 6.5–8.1)

## 2017-09-01 LAB — CBC WITH DIFFERENTIAL/PLATELET
Basophils Absolute: 0 10*3/uL (ref 0.0–0.1)
Basophils Relative: 0 %
EOS PCT: 3 %
Eosinophils Absolute: 0.2 10*3/uL (ref 0.0–0.7)
HCT: 24.5 % — ABNORMAL LOW (ref 36.0–46.0)
Hemoglobin: 5.7 g/dL — CL (ref 12.0–15.0)
LYMPHS ABS: 1.6 10*3/uL (ref 0.7–4.0)
Lymphocytes Relative: 24 %
MCH: 16.8 pg — AB (ref 26.0–34.0)
MCHC: 23.3 g/dL — AB (ref 30.0–36.0)
MCV: 72.3 fL — AB (ref 78.0–100.0)
MONO ABS: 0.7 10*3/uL (ref 0.1–1.0)
Monocytes Relative: 11 %
NEUTROS ABS: 4.2 10*3/uL (ref 1.7–7.7)
Neutrophils Relative %: 62 %
PLATELETS: 325 10*3/uL (ref 150–400)
RBC: 3.39 MIL/uL — ABNORMAL LOW (ref 3.87–5.11)
RDW: 23.3 % — AB (ref 11.5–15.5)
WBC: 6.7 10*3/uL (ref 4.0–10.5)

## 2017-09-01 LAB — POC OCCULT BLOOD, ED: FECAL OCCULT BLD: POSITIVE — AB

## 2017-09-01 LAB — PROTIME-INR
INR: 1.34
Prothrombin Time: 16.5 seconds — ABNORMAL HIGH (ref 11.4–15.2)

## 2017-09-01 LAB — PREPARE RBC (CROSSMATCH)

## 2017-09-01 MED ORDER — MIRTAZAPINE 15 MG PO TABS
15.0000 mg | ORAL_TABLET | Freq: Every day | ORAL | Status: DC
  Administered 2017-09-02: 15 mg via ORAL
  Filled 2017-09-01 (×3): qty 1

## 2017-09-01 MED ORDER — LACTATED RINGERS IV SOLN: INTRAVENOUS | Status: DC

## 2017-09-01 MED ORDER — POTASSIUM CHLORIDE CRYS ER 20 MEQ PO TBCR
60.0000 meq | EXTENDED_RELEASE_TABLET | Freq: Once | ORAL | Status: AC
  Administered 2017-09-01: 60 meq via ORAL
  Filled 2017-09-01: qty 3

## 2017-09-01 MED ORDER — SODIUM CHLORIDE 0.9% IV SOLUTION
Freq: Once | INTRAVENOUS | Status: AC
  Administered 2017-09-01: 14:00:00 via INTRAVENOUS

## 2017-09-01 MED ORDER — ESCITALOPRAM OXALATE 10 MG PO TABS
10.0000 mg | ORAL_TABLET | Freq: Every day | ORAL | Status: DC
Start: 2017-09-01 — End: 2017-09-03
  Administered 2017-09-02: 10 mg via ORAL
  Filled 2017-09-01 (×2): qty 1

## 2017-09-01 MED ORDER — DIGOXIN 125 MCG PO TABS
0.1250 mg | ORAL_TABLET | ORAL | Status: DC
  Administered 2017-09-03: 0.125 mg via ORAL
  Filled 2017-09-01: qty 1

## 2017-09-01 MED ORDER — SODIUM CHLORIDE 0.9 % IV SOLN
INTRAVENOUS | Status: DC
  Administered 2017-09-01 – 2017-09-03 (×3): via INTRAVENOUS

## 2017-09-01 MED ORDER — METOPROLOL TARTRATE 25 MG PO TABS
25.0000 mg | ORAL_TABLET | Freq: Two times a day (BID) | ORAL | Status: DC
  Filled 2017-09-01: qty 1

## 2017-09-01 NOTE — ED Notes (Signed)
Page to Internal Medicine teaching Service to alert them that pt is here and will be seen in ED ([phone])

## 2017-09-01 NOTE — ED Notes (Signed)
Admitting at bedside 

## 2017-09-01 NOTE — ED Notes (Signed)
RN tried 2 attempts and Caryl Pina attempted twice and was able to get line. Daughter requested it be wrapped and taped well since patient may attempt to pull it out.

## 2017-09-01 NOTE — Progress Notes (Signed)
Patient arrived in stretcher accompanied by the staff, IV Left  FA infusing KVO, cardiac monitor placed and notified CMD on box 20, afib and pt has pacemaker.  DNR arm band placed on the placed.  Will continue to monitor.  Oncoming nurse notified that pt got 1 unit of blood and needs another unit.

## 2017-09-01 NOTE — H&P (Signed)
Date: 09/01/2017               Patient Name:  Brooke Griffin MRN: [id number]  DOB: [birth year] Age / Sex: 84 y.o., female   PCP: [facility] [place]         Medical Service: Internal Medicine Teaching Service         Attending Physician: Dr. Lynnae January, Real Cons, MD    First Contact: Dr. Eileen Stanford Pager: [phone]  Second Contact: Dr. Jari Favre Pager: [phone]       After Hours (After 5p/  First Contact Pager: [phone]  weekends / holidays): Second Contact Pager: [phone]   Chief Complaint: Generalized weakness  History of Present Illness:  Ms. Bias is an 84 year old female with a past medical history significant for diverticulosis status post colonoscopy with snare polypectomy (x8) on 09/05/2010, dementia, PAD s/p right lower extremity BKA, A. fib with pacemaker placement 20 years ago, hypertension, CVA 20 years ago, scoliosis and spinal stenosis who presented to the ED accompanied by daughter and found to generally weak.  History was obtained from daughter as mother was unable to converse due to decrease baseline mental status.  Daughter reports that patient was in her usual state of health until 3 days ago when she became lethargic, somnolent and weak. She spent 10 days at [place], a nursing facility and when she picked up her mom this past Sunday (08/29/17), she felt very weak, extremely lethargic and did not feel herself.  He also noticed that her mother had a decreased appetite. She is usually on a pureed and ensure diet. Yesterday she received a call from [place] (PCP) that her mom's hemoglobin had dropped to 6.1 and was subsequently brought into the ED.  She did not receive any report from this skilled nursing facility about mother having melena, hematochezia, bright red blood per rectum, diarrhea or hemoptysis.  Patient currently denies chest pain, shortness of breath, palpitation, headache, general pain, abdominal pain, constipation or diarrhea however  her answers are not assuring due to decreased baseline mental status.   On chart review patient was admitted on September 19, 2010 for acute GI bleed secondary to diverticulosis.  During that admission, she underwent colonoscopy with polypectomy x8.  ED course: Vitals stable, hemoglobin 5.7, FOBT positive.  She received 2 units of packed red blood cell transfusion.   GI was consulted and did not recommend IV PPI and suggested clear liquid diet.  Meds:  Current Meds  Medication Sig  . acetaminophen (TYLENOL) 500 MG tablet Take 1,000 mg by mouth every 6 (six) hours as needed for mild pain or moderate pain.  Marland Kitchen apixaban (ELIQUIS) 2.5 MG TABS tablet Take 1 tablet (2.5 mg total) by mouth 2 (two) times daily.  . digoxin (LANOXIN) 0.125 MG tablet Take 1 tablet (0.125 mg total) by mouth every other day.  . ergocalciferol (VITAMIN D2) 50000 units capsule Take 50,000 Units by mouth once a week.  . escitalopram (LEXAPRO) 10 MG tablet Take 10 mg by mouth at bedtime.  . fluticasone (FLONASE) 50 MCG/ACT nasal spray Place 2 sprays into both nostrils daily.  . metoprolol (LOPRESSOR) 50 MG tablet Take 1 tablet (50 mg total) by mouth 2 (two) times daily.  . mirtazapine (REMERON) 15 MG tablet Take 15 mg by mouth at bedtime.  . Multiple Vitamins-Minerals (ICAPS AREDS FORMULA PO) Take 1 capsule by mouth 2 (two) times daily.   Marland Kitchen zinc oxide (BALMEX) 11.3 % CREA cream Apply 1  application topically daily as needed.      Allergies: Allergies as of 09/01/2017 - Review Complete 09/01/2017  Allergen Reaction Noted  . Donepezil Nausea And Vomiting 11/21/2014   Past Medical History:  Diagnosis Date  . Amiodarone    Amiodarone was started during hospitalization September, 2012  . Bradycardia   . Chest pain    Nuclear, hospital, September, 2012, fixed inferolateral defect and compatible with remote infarct, EF 66%, no ischemia  . Cholecystitis    Laparoscopic cholecystectomy September 20 01  . Dementia   .  Diverticulosis   . Ejection fraction    EF 55-60%, echo, November 18, 2010, hypokinesis of the basal inferior wall  . GI bleed   . Hematoma    Right flank hematoma / cellulitis  September, 2012  . Hypertension   . Peripheral arterial disease ([place])   . Permanent atrial fibrillation (HCC)    previously anticoagulated with plavix by Dr Ron Parker  . Pleural effusion    Right pleural effusion, thoracentesis, October, 2012, for therapeutic reasons  . Stroke [facility])     Family History:  There is a family history of hypertension, CVA  Social History:  Previous tobacco and alcohol use for greater than 50 years however has not smoked tobacco or drink alcohol in 3 weeks.  Review of Systems: A complete ROS was negative except as per HPI.   Physical Exam: Blood pressure 130/65, pulse 74, temperature 97.8 F (36.6 C), temperature source Oral, resp. rate 14, height 5\' 3"  (1.6 m), weight 105 lb (47.6 kg), SpO2 91 %.   Physical Exam  Constitutional: She is well-developed, well-nourished, and in no distress. No distress.  HENT:  Head: Normocephalic and atraumatic.  Eyes: No scleral icterus.  Neck: Neck supple.  Cardiovascular: An irregularly irregular rhythm present. Bradycardia present. Exam reveals no gallop and no friction rub.  No murmur heard. Pulmonary/Chest: No tachypnea. No respiratory distress.  Abdominal: Soft. Normal appearance.  Skin: She is not diaphoretic.  Ext: 1+ pitting edema on left LE, right lower extremity BKA, right upper extremity edematous    EKG: personally reviewed my interpretation is pending. At 7:30 PM, patient was in A. fib with heart rate of 80   Assessment & Plan by Problem: Active Problems:   Acute anemia   Ms. Slider is an 84 year old female with a past medical history significant for diverticulosis status post colonoscopy with snare polypectomy (x8) on 09/05/2010, dementia, PAD, A. fib with pacemaker placement 20 years ago, hypertension, CVA 20 years ago,  scoliosis and spinal stenosis who presented to the ED accompanied by daughter and found to generally weak.   Symptomatic anemia (microcytic vs GI blood loss anemia) History of diverticulosis Patient's daughter received a call from her PCP indicating the hemoglobin was 6.1.  Previously she had spent 10 days in a skilled nursing facility however there were no reports of melena, hematochezia, bright red blood per rectum, frank blood diarrhea or hemoptysis.  Patient has been lethargic, somnolent, had decreased appetite for 3 days now.  In the ED her vitals stable, hemoglobin was 5.7, FOBT positive from baseline of 12.7 on 06/11/2078.   -Status post 2 packed red blood cell transfusion -Follow-up posttransfusion CBC -GI [place]:   *No PPI  *Start Clear liquid  *Will follow-up on patient  Right Upper extremity edema, chronic  DDx include lymphedema vs DVT  -Follow-up right upper extremity Doppler -Denies any history of surgery however daughter states that she tends to sleep on her right arm  Atrial fibrillation with pacemaker -Currently in A. fib with heart rate of 80 -Physical exam patient had an irregular pulse -We will start metoprolol tartrate 25 mg p.o. twice daily with hold parameters for heart rate less than 65. -Patient has episodes of bradycardia with heart rate range of 51-80 -Hold Eliquis -Start home digoxin 0.125 mg every other day (first dose 09/03/17)  Hypokalemia  -K on admission 3.2 -Status post K-Dur KDur 43mEq -We will continue to follow   FEN: Normal saline, replace electrolytes as needed, liquid diet VTE prophylaxis: SCD CODE STATUS: DNR  Dispo: Admit patient to inpatient with expected length of stay greater than 2 days   Signed: Jean Rosenthal, MD 09/01/2017, 5:00 PM  Pager: [phone]

## 2017-09-01 NOTE — ED Triage Notes (Signed)
Pt is here with her daughter.  Pt was sent here by [facility] due to low Hgb and abnormal labs. Pt has been generally weak, she lives at home with her daughter but was recently in a nursing home for 10 days for respite care but came home on Sunday.  Pt has hx of dementia, daughter states that she can not communicate anymore.Daugher states that she has hx of diverticulits but she states that she has not noticed any black stools

## 2017-09-01 NOTE — Progress Notes (Signed)
Patient at this time refusing IV and being mildly combative RN Luvenia Starch made aware

## 2017-09-01 NOTE — ED Notes (Signed)
Pt Hgb yesterday was 6.1 per paperwork from [facility]

## 2017-09-01 NOTE — ED Triage Notes (Signed)
Nurse first received call from [facility] program that pt was being sent due to low hgb and abnormal labs. [facility] coordinator states she will fax labs to pod B station. Pt has hx of dementia. Pt has been in a nursing home for a week for respite care. Pt accompanied by family.

## 2017-09-01 NOTE — ED Provider Notes (Signed)
[facility] EMERGENCY DEPARTMENT Provider Note   CSN: [id number] Arrival date & time: 09/01/17  1100     History   Chief Complaint Chief Complaint  Patient presents with  . Abnormal Lab    HPI Brooke Griffin is a 84 y.o. female.  84 yo F with a chief complaint of generalized weakness.  This been going on for the past week or so.  The patient was in respite care for the past 2 or 3 weeks.  When she came back to the family they felt that she was less active than she was before she was dropped off.  She feels just generally weak but does not have any other symptoms.  Family feels that she is eating and drinking a little bit less.  She denies abdominal pain chest pain or shortness of breath denies vomiting or diarrhea.  They deny dark stool or blood in her stool.  She had labs drawn at her PCP yesterday and had a hemoglobin of 6.1.  Was then referred to come to the ED today.  The history is provided by the patient and the spouse.  Abnormal Lab  Illness  This is a new problem. The current episode started more than 1 week ago. The problem occurs constantly. The problem has been gradually worsening. Pertinent negatives include no chest pain, no headaches and no shortness of breath. Nothing aggravates the symptoms. Nothing relieves the symptoms. She has tried nothing for the symptoms. The treatment provided no relief.    Past Medical History:  Diagnosis Date  . Amiodarone    Amiodarone was started during hospitalization September, 2012  . Bradycardia   . Chest pain    Nuclear, hospital, September, 2012, fixed inferolateral defect and compatible with remote infarct, EF 66%, no ischemia  . Cholecystitis    Laparoscopic cholecystectomy September 20 01  . Dementia   . Diverticulosis   . Ejection fraction    EF 55-60%, echo, November 18, 2010, hypokinesis of the basal inferior wall  . GI bleed   . Hematoma    Right flank hematoma / cellulitis  September, 2012  .  Hypertension   . Peripheral arterial disease ([place])   . Permanent atrial fibrillation (HCC)    previously anticoagulated with plavix by Dr Ron Parker  . Pleural effusion    Right pleural effusion, thoracentesis, October, 2012, for therapeutic reasons  . Stroke [facility])     Patient Active Problem List   Diagnosis Date Noted  . Above knee amputation of right lower extremity ([place]) 01/22/2016  . Bradycardia 12/13/2015  . Atherosclerosis of native artery of right leg with gangrene ([place]) 09/18/2015  . Hypernatremia 09/18/2015  . Hypokalemia 09/18/2015  . Leukocytosis 09/18/2015  . Atherosclerosis of native artery of right lower extremity with gangrene ([place]) 09/17/2015  . PVD (peripheral vascular disease) ([place]) 09/17/2015  . Age-related osteoporosis with current pathological fracture with routine healing 08/14/2015  . Closed compression fracture of second lumbar vertebra ([place]) 08/14/2015  . Benign essential hypertension 03/18/2015  . Depression 03/18/2015  . Diverticulosis 03/18/2015  . Hyperglycemia 03/18/2015  . Insomnia 03/18/2015  . Polycythemia 03/18/2015  . Lumbar compression fracture ([place]) 11/04/2013  . Thoracic compression fracture ([place]) 11/04/2013  . Tachycardia-bradycardia ([place])   . Cholecystitis chronic, acute 01/05/2011  . Cardiac pacemaker in situ   . Advanced dementia   . Hematoma   . Essential hypertension   . Rapid atrial fibrillation ([place])   . Pleural effusion   . Ejection fraction   .  CVA (cerebral infarction)   . Amiodarone   . Precordial chest pain     Past Surgical History:  Procedure Laterality Date  . CHOLECYSTECTOMY  11/21/10  . hysterectomy - unknown type    . PACEMAKER GENERATOR CHANGE Right 11/26/2015   [employer] Adapta gen change by Dr Purvis Kilts  . PACEMAKER INSERTION       OB History   None      Home Medications    Prior to Admission medications   Medication Sig Start Date End Date Taking? Authorizing Provider  acetaminophen (TYLENOL) 500 MG  tablet Take 1,000 mg by mouth every 6 (six) hours as needed for mild pain or moderate pain.    [provider]  apixaban (ELIQUIS) 2.5 MG TABS tablet Take 1 tablet (2.5 mg total) by mouth 2 (two) times daily. 06/10/16   Allred, Jeneen Rinks, MD  digoxin (LANOXIN) 0.125 MG tablet Take 1 tablet (0.125 mg total) by mouth every other day. 07/02/16   Allred, Jeneen Rinks, MD  ergocalciferol (VITAMIN D2) 50000 units capsule Take 50,000 Units by mouth once a week.    [provider]  escitalopram (LEXAPRO) 10 MG tablet Take 10 mg by mouth at bedtime.    [provider]  fluticasone (FLONASE) 50 MCG/ACT nasal spray Place 2 sprays into both nostrils daily.    [provider]  metoprolol (LOPRESSOR) 50 MG tablet Take 1 tablet (50 mg total) by mouth 2 (two) times daily. 12/05/14   Carlena Bjornstad, MD  mirtazapine (REMERON) 15 MG tablet Take 15 mg by mouth at bedtime.    [provider]  Multiple Vitamins-Minerals (ICAPS AREDS FORMULA PO) Take by mouth.    [provider]  zinc oxide (BALMEX) 11.3 % CREA cream Apply 1 application topically 2 (two) times daily.    [provider]    Family History Family History  Problem Relation Age of Onset  . Hypertension Mother   . Heart disease Mother        heart attack  . Heart disease Unknown   . Hypertension Unknown     Social History Social History   Tobacco Use  . Smoking status: Former Research scientist (life sciences)  . Smokeless tobacco: Never Used  Substance Use Topics  . Alcohol use: Yes    Alcohol/week: 0.0 oz    Comment: occasional  . Drug use: No     Allergies   Donepezil   Review of Systems Review of Systems  Constitutional: Negative for chills and fever.  HENT: Negative for congestion and rhinorrhea.   Eyes: Negative for redness and visual disturbance.  Respiratory: Negative for shortness of breath and wheezing.   Cardiovascular: Negative for chest pain and palpitations.  Gastrointestinal: Positive for blood  in stool. Negative for nausea and vomiting.  Genitourinary: Negative for dysuria and urgency.  Musculoskeletal: Negative for arthralgias and myalgias.  Skin: Negative for pallor and wound.  Neurological: Negative for dizziness and headaches.     Physical Exam Updated Vital Signs BP 116/71   Pulse (!) 56   Temp 98.4 F (36.9 C) (Oral)   Resp 18   Ht 5\' 3"  (1.6 m)   Wt 47.6 kg (105 lb)   SpO2 95%   BMI 18.60 kg/m   Physical Exam  Constitutional: She is oriented to person, place, and time. She appears well-developed and well-nourished. No distress.  HENT:  Head: Normocephalic and atraumatic.  Eyes: Pupils are equal, round, and reactive to light. EOM are normal.  Neck: Normal range of motion.  Neck supple.  Cardiovascular: Normal rate and regular rhythm. Exam reveals no gallop and no friction rub.  No murmur heard. Pulmonary/Chest: Effort normal. She has no wheezes. She has no rales.  Abdominal: Soft. She exhibits no distension. There is no tenderness.  Genitourinary:  Genitourinary Comments: Soft brown   Musculoskeletal: She exhibits no edema or tenderness.  Right AKA  Neurological: She is alert and oriented to person, place, and time.  Skin: Skin is warm and dry. She is not diaphoretic.  Psychiatric: She has a normal mood and affect. Her behavior is normal.  Nursing note and vitals reviewed.    ED Treatments / Results  Labs (all labs ordered are listed, but only abnormal results are displayed) Labs Reviewed  COMPREHENSIVE METABOLIC PANEL - Abnormal; Notable for the following components:      Result Value   Sodium 147 (*)    Potassium 3.2 (*)    Chloride 116 (*)    Glucose, Bld 141 (*)    Calcium 8.0 (*)    Total Protein 4.8 (*)    Albumin 2.2 (*)    GFR calc non Af Amer 57 (*)    All other components within normal limits  CBC WITH DIFFERENTIAL/PLATELET - Abnormal; Notable for the following components:   RBC 3.39 (*)    Hemoglobin 5.7 (*)    HCT 24.5 (*)     MCV 72.3 (*)    MCH 16.8 (*)    MCHC 23.3 (*)    RDW 23.3 (*)    All other components within normal limits  PROTIME-INR - Abnormal; Notable for the following components:   Prothrombin Time 16.5 (*)    All other components within normal limits  POC OCCULT BLOOD, ED - Abnormal; Notable for the following components:   Fecal Occult Bld POSITIVE (*)    All other components within normal limits  TYPE AND SCREEN  PREPARE RBC (CROSSMATCH)    EKG None  Radiology No results found.  Procedures Procedures (including critical care time)  Medications Ordered in ED Medications  0.9 %  sodium chloride infusion (Manually program via Guardrails IV Fluids) (has no administration in time range)     Initial Impression / Assessment and Plan / ED Course  I have reviewed the triage vital signs and the nursing notes.  Pertinent labs & imaging results that were available during my care of the patient were reviewed by me and considered in my medical decision making (see chart for details).     84 yo F with a chief complaint of weakness.  Patient was found to have a hemoglobin of 5.7.  Will transfuse 2 units.  Has a history of diverticular bleed though family does not endorse any dark or bloody stools.  Soft and brown on my exam with heme positive.  Discussed with internal medicine teaching service who will evaluate.  CRITICAL CARE Performed by: Cecilio Asper   Total critical care time: 35 minutes  Critical care time was exclusive of separately billable procedures and treating other patients.  Critical care was necessary to treat or prevent imminent or life-threatening deterioration.  Critical care was time spent personally by me on the following activities: development of treatment plan with patient and/or surrogate as well as nursing, discussions with consultants, evaluation of patient's response to treatment, examination of patient, obtaining history from patient or surrogate, ordering  and performing treatments and interventions, ordering and review of laboratory studies, ordering and review of radiographic studies, pulse oximetry and re-evaluation of  patient's condition.   The patients results and plan were reviewed and discussed.   Any x-rays performed were independently reviewed by myself.   Differential diagnosis were considered with the presenting HPI.  Medications  0.9 %  sodium chloride infusion (Manually program via Guardrails IV Fluids) (has no administration in time range)    Vitals:   09/01/17 1131 09/01/17 1132 09/01/17 1215 09/01/17 1245  BP: 116/83  (!) 116/50 116/71  Pulse: 79   (!) 56  Resp: 18     Temp: 98.4 F (36.9 C)     TempSrc: Oral     SpO2: 100%   95%  Weight:  47.6 kg (105 lb)    Height:  5\' 3"  (1.6 m)      Final diagnoses:  Symptomatic anemia    Admission/ observation were discussed with the admitting physician, patient and/or family and they are comfortable with the plan.    Final Clinical Impressions(s) / ED Diagnoses   Final diagnoses:  Symptomatic anemia    ED Discharge Orders    None       Deno Etienne, DO 09/01/17 1346

## 2017-09-01 NOTE — ED Notes (Signed)
Pt resting comfortably. No needs at this time.

## 2017-09-02 ENCOUNTER — Encounter (HOSPITAL_COMMUNITY): Payer: Self-pay | Admitting: Physician Assistant

## 2017-09-02 DIAGNOSIS — R1310 Dysphagia, unspecified: Secondary | ICD-10-CM

## 2017-09-02 DIAGNOSIS — Z79899 Other long term (current) drug therapy: Secondary | ICD-10-CM

## 2017-09-02 DIAGNOSIS — K5730 Diverticulosis of large intestine without perforation or abscess without bleeding: Secondary | ICD-10-CM

## 2017-09-02 DIAGNOSIS — I739 Peripheral vascular disease, unspecified: Secondary | ICD-10-CM

## 2017-09-02 DIAGNOSIS — M4800 Spinal stenosis, site unspecified: Secondary | ICD-10-CM

## 2017-09-02 DIAGNOSIS — Z8673 Personal history of transient ischemic attack (TIA), and cerebral infarction without residual deficits: Secondary | ICD-10-CM

## 2017-09-02 DIAGNOSIS — F0390 Unspecified dementia without behavioral disturbance: Secondary | ICD-10-CM

## 2017-09-02 DIAGNOSIS — Z7901 Long term (current) use of anticoagulants: Secondary | ICD-10-CM

## 2017-09-02 DIAGNOSIS — Z9889 Other specified postprocedural states: Secondary | ICD-10-CM

## 2017-09-02 DIAGNOSIS — I10 Essential (primary) hypertension: Secondary | ICD-10-CM

## 2017-09-02 DIAGNOSIS — Z66 Do not resuscitate: Secondary | ICD-10-CM

## 2017-09-02 DIAGNOSIS — I4891 Unspecified atrial fibrillation: Secondary | ICD-10-CM

## 2017-09-02 DIAGNOSIS — M419 Scoliosis, unspecified: Secondary | ICD-10-CM

## 2017-09-02 DIAGNOSIS — E876 Hypokalemia: Secondary | ICD-10-CM

## 2017-09-02 DIAGNOSIS — D649 Anemia, unspecified: Principal | ICD-10-CM

## 2017-09-02 DIAGNOSIS — Z86010 Personal history of colonic polyps: Secondary | ICD-10-CM

## 2017-09-02 DIAGNOSIS — R195 Other fecal abnormalities: Secondary | ICD-10-CM

## 2017-09-02 DIAGNOSIS — Z950 Presence of cardiac pacemaker: Secondary | ICD-10-CM

## 2017-09-02 LAB — BASIC METABOLIC PANEL
ANION GAP: 7 (ref 5–15)
BUN: 11 mg/dL (ref 8–23)
CHLORIDE: 116 mmol/L — AB (ref 98–111)
CO2: 25 mmol/L (ref 22–32)
Calcium: 7.9 mg/dL — ABNORMAL LOW (ref 8.9–10.3)
Creatinine, Ser: 0.78 mg/dL (ref 0.44–1.00)
GFR calc Af Amer: >60 mL/min (ref >60)
GFR calc non Af Amer: >60 mL/min (ref >60)
Glucose, Bld: 82 mg/dL (ref 70–99)
POTASSIUM: 3.6 mmol/L (ref 3.5–5.1)
Sodium: 148 mmol/L — ABNORMAL HIGH (ref 135–145)

## 2017-09-02 LAB — CBC
HCT: 30.8 % — ABNORMAL LOW (ref 36.0–46.0)
Hemoglobin: 8.3 g/dL — ABNORMAL LOW (ref 12.0–15.0)
MCH: 20.4 pg — ABNORMAL LOW (ref 26.0–34.0)
MCHC: 26.9 g/dL — ABNORMAL LOW (ref 30.0–36.0)
MCV: 75.9 fL — ABNORMAL LOW (ref 78.0–100.0)
PLATELETS: 261 10*3/uL (ref 150–400)
RBC: 4.06 MIL/uL (ref 3.87–5.11)
RDW: 25.2 % — ABNORMAL HIGH (ref 11.5–15.5)
WBC: 7.6 10*3/uL (ref 4.0–10.5)

## 2017-09-02 LAB — HEMOGLOBIN AND HEMATOCRIT, BLOOD
HCT: 31.2 % — ABNORMAL LOW (ref 36.0–46.0)
HEMOGLOBIN: 8.4 g/dL — AB (ref 12.0–15.0)

## 2017-09-02 LAB — DIGOXIN LEVEL: Digoxin Level: 0.3 ng/mL — ABNORMAL LOW (ref 0.8–2.0)

## 2017-09-02 LAB — IRON AND TIBC
IRON: 24 ug/dL — AB (ref 28–170)
SATURATION RATIOS: 10 % — AB (ref 10.4–31.8)
TIBC: 246 ug/dL — ABNORMAL LOW (ref 250–450)
UIBC: 222 ug/dL

## 2017-09-02 LAB — TYPE AND SCREEN
ABO/RH(D): O POS
ANTIBODY SCREEN: NEGATIVE
Unit division: 0
Unit division: 0

## 2017-09-02 LAB — BPAM RBC
BLOOD PRODUCT EXPIRATION DATE: 201908092359
Blood Product Expiration Date: 201908092359
ISSUE DATE / TIME: 201907101359
ISSUE DATE / TIME: 201907102026
UNIT TYPE AND RH: 5100
Unit Type and Rh: 5100

## 2017-09-02 LAB — RETICULOCYTES
RBC.: 4.06 MIL/uL (ref 3.87–5.11)
Retic Count, Absolute: 32.5 10*3/uL (ref 19.0–186.0)
Retic Ct Pct: 0.8 % (ref 0.4–3.1)

## 2017-09-02 LAB — FERRITIN: FERRITIN: 20 ng/mL (ref 11–307)

## 2017-09-02 LAB — VITAMIN B12: Vitamin B-12: 564 pg/mL (ref 180–914)

## 2017-09-02 LAB — LACTATE DEHYDROGENASE: LDH: 205 U/L — ABNORMAL HIGH (ref 98–192)

## 2017-09-02 MED ORDER — PEG-KCL-NACL-NASULF-NA ASC-C 100 G PO SOLR
0.5000 | Freq: Once | ORAL | Status: DC
  Filled 2017-09-02: qty 1

## 2017-09-02 MED ORDER — PEG-KCL-NACL-NASULF-NA ASC-C 100 G PO SOLR: 0.5000 | Freq: Once | ORAL | Status: DC

## 2017-09-02 MED ORDER — METOCLOPRAMIDE HCL 5 MG/ML IJ SOLN: 10.0000 mg | Freq: Once | INTRAMUSCULAR | Status: DC

## 2017-09-02 MED ORDER — RESOURCE THICKENUP CLEAR PO POWD
ORAL | Status: DC | PRN
  Filled 2017-09-02: qty 125

## 2017-09-02 MED ORDER — IOPAMIDOL (ISOVUE-300) INJECTION 61%
INTRAVENOUS | Status: AC
  Administered 2017-09-02: 13:00:00
  Filled 2017-09-02: qty 30

## 2017-09-02 MED ORDER — METOPROLOL TARTRATE 50 MG PO TABS
50.0000 mg | ORAL_TABLET | Freq: Two times a day (BID) | ORAL | Status: DC
  Administered 2017-09-02 – 2017-09-03 (×3): 50 mg via ORAL
  Filled 2017-09-02 (×3): qty 1

## 2017-09-02 MED ORDER — PEG-KCL-NACL-NASULF-NA ASC-C 100 G PO SOLR: 1.0000 | Freq: Once | ORAL | Status: DC

## 2017-09-02 NOTE — Progress Notes (Signed)
Patient is refusing telemetry monitoring, MD is aware.

## 2017-09-02 NOTE — Progress Notes (Signed)
Subjective: Day 1  Overnight: No acute events reported  This morning Brooke Griffin was evaluated with daughter at bedside.  Daughter reports that mother is doing well and was able to drink apple juice this morning.  On conversation with Brooke Griffin, she denies any headaches, vision changes chest pain, palpitation, shortness of breath, abdominal pain or pain in the right upper extremity.  Daughter reports that swelling in the right arm has significantly improved.   Objective:  Vital signs in last 24 hours: Vitals:   09/01/17 2021 09/01/17 2044 09/01/17 2300 09/02/17 0639  BP: 131/81 (!) 128/56 (!) 117/91 138/60  Pulse: 80 80 96 97  Resp: 16 20 20 16   Temp: 98.8 F (37.1 C) 98.1 F (36.7 C) 98 F (36.7 C) 98.5 F (36.9 C)  TempSrc: Axillary Axillary Axillary   SpO2: 100% 100% 100% 95%  Weight:      Height:       Physical exam Constitutional: No acute distress, lying in bed, pleasant HEENT: Normocephalic, atraumatic Eyes: Nonicteric Cardiovascular: Pulse is irregularly on palpation auscultation Pulmonary: Clear to auscultation bilaterally on anterior aspect Abdomen: Soft, nondistended, nontender to palpation  Assessment/Plan:  Active Problems:   Acute anemia  Brooke Griffin is an 84 year old female with a past medical history significant for diverticulosis status post colonoscopy with snare polypectomy (x8) on 09/05/2010, dementia, PAD, A. fib with pacemaker placement 20 years ago, hypertension, CVA 20 years ago, scoliosis and spinal stenosis who presented to the ED accompanied by daughter and found to generally weak.    Symptomatic anemia (Microcytic vs anemia of chronic disease vs GI blood loss anemia) History of diverticulosis Patient's daughter received a call from her PCP at [facility] indicating the hemoglobin was 6.1. Previously she had spent 10 days in a skilled nursing facility however there were no reports of melena, hematochezia, bright red blood per rectum, frank blood  diarrhea or hemoptysis.  Patient has been lethargic, somnolent, had decreased appetite for 3 days now.  In the ED her vitals stable, hemoglobin was 5.7 from baseline of 12.7 on 06/10/2016 and FOBT positive.  -Status post 2 packed red blood cell transfusion -H&H stable 8.3 <--8.4 -GI [place]:       *No PPI             *Start Clear liquid             *Recommended CT abdomen and pelvis with contrast via NG tube swallow prep, colonoscopy and possible EGD. -Patient has advanced dementia at baseline and given the invasive nature of these procedures, we had a discussion with patient and her daughter.  Decision was made to simply follow patient's hemoglobin and transfuse as needed.  [facility] of hemoglobin. -Labs pending  *Vitamin B12  *CBC -Low iron of 24, low TIBC of 246, low saturation rate.  Indicating possible anemia of chronic disease   Right Upper extremity edema, chronic  DDx include lymphedema vs DVT  -Denies any history of surgery however daughter states that she tends to sleep on her right arm.  On exam today swelling had significantly improved mainly because patient was position properly.   Atrial fibrillation with pacemaker -On physical exam, patient had an irregular palpable pulse -Continue metoprolol tartrate 25 mg p.o. twice daily with hold parameters for heart rate less than 65. -Patient has episodes of bradycardia with heart rate range of 51-80 -Hold Eliquis -Digoxin level on 09/02/2017 of 0.3 -Start home digoxin 0.125 mg every other  day (first dose 09/03/17)   Hypokalemia  -K on admission 3.2, currently normal at 3.6 -Status post K-Dur KDur 43mEq -We will continue to follow   FEN: Normal saline, replace electrolytes as needed, liquid diet VTE prophylaxis: SCD CODE STATUS: DNR  Dispo: Admit patient to inpatient with expected length of stay greater than 2 days     Jean Rosenthal, MD 09/02/2017, 7:00 AM Pager: [phone]

## 2017-09-02 NOTE — Consult Note (Addendum)
[facility] Consult: 8:18 AM 09/02/2017  LOS: 1 day    Referring Provider: Dr Lynnae January  Primary Care Physician:  [facility]:  Dr. Henrene Pastor, inpt colonoscopy 2012, never followed up     Reason for Consultation:  Anemia, FBOT+   HPI: Maylen Waltermire is a 84 y.o. female.  Pt with dementia.  Hx cardiac dz on Eliquis.  Anemia requiring transfusions 08/2010, 11/2010.   Multiple colon polyps and diverticulosis on colonoscopy in 2012 at time of diverticular bleed.  Polyps pathology: TAs and TVAs, later with HGD.  Recommendation was for rapid surveillance colonoscopy in 3 months.  However she never followed up with Dr Henrene Pastor.  Pt spent 10 days at [facility] for respite care and returned to home care with her dtr 08/29/17. Within short time after that, her daughter noticed patient was lethargic, somnolent, weak.  Appetite diminished, she is normally on a pured diet with supplemental Ensure.  Labs obtained revealed Hgb 6.1.  Advised to head to ED.  No reports from SNF of melena, hematochezia, blood per rectum, change in bowel habits or unusual bleeding.  At [facility] ED yesterday hemoglobin 5.7, c/w 12.7 in 05/2016. She received 2 U PRBCs and hemoglobin is up to 8.4.  MCV low at 72.   Platelets WNL.  PT/INR 16.5, 1.3. Stool FOBT+.   BUN is not elevated.  Patient's blood pressures are trending somewhat hypotensive as low as 116/50 but also up to 130s/60s.  No tachycardia. No home PPI, ASA, NSAIDs.    Since arrival, no BM's, no nausea, no abd pain.    In reviewing previous records, a 11/27/2010 CT abdomen/pelvis, obtained after  11/21/2010 cholecystectomy of gangrenous gallbladder, revealed small fluid collection in the GB fossa.  There were multiple soft tissue nodules within the  peritoneal cavity and omental fat suspicious for transperitoneal tumor spread.  On review of subsequent records, no mention is made of this finding and there are no biopsies corresponding with this anatomical abnormality.     Past Medical History:  Diagnosis Date  . Amiodarone    Amiodarone was started during hospitalization September, 2012  . Bradycardia   . Chest pain    Nuclear, hospital, September, 2012, fixed inferolateral defect and compatible with remote infarct, EF 66%, no ischemia  . Cholecystitis    Laparoscopic cholecystectomy September 20 01  . Dementia   . Diverticulosis   . Ejection fraction    EF 55-60%, echo, November 18, 2010, hypokinesis of the basal inferior wall  . GI bleed   . Hematoma    Right flank hematoma / cellulitis  September, 2012  . History of blood transfusion 09/01/2017  . Hypertension   . Peripheral arterial disease ([place])   . Permanent atrial fibrillation ([facility])    previously anticoagulated with plavix by Dr Ron Parker  . Pleural effusion    Right pleural effusion, thoracentesis, October, 2012, for therapeutic reasons  . Stroke ([place])   . Symptomatic anemia 09/01/2017    Past Surgical History:  Procedure Laterality Date  . CHOLECYSTECTOMY  11/21/10  . hysterectomy - unknown type    . PACEMAKER GENERATOR CHANGE Right 11/26/2015   [employer] Adapta gen change by Dr Purvis Kilts  . PACEMAKER INSERTION      Prior to Admission medications   Medication Sig Start Date End Date Taking? Authorizing Provider  acetaminophen (TYLENOL) 500 MG tablet Take 1,000 mg by mouth every 6 (six) hours as needed for mild pain or moderate pain.   Yes [provider]  apixaban (ELIQUIS) 2.5 MG TABS tablet Take 1 tablet (2.5 mg total) by mouth 2 (two) times daily. 06/10/16  Yes Allred, Jeneen Rinks, MD  digoxin (LANOXIN) 0.125 MG tablet Take 1 tablet (0.125 mg total) by mouth every other day. 07/02/16  Yes Allred, Jeneen Rinks, MD  ergocalciferol (VITAMIN D2) 50000 units capsule Take  50,000 Units by mouth once a week.   Yes [provider]  escitalopram (LEXAPRO) 10 MG tablet Take 10 mg by mouth at bedtime.   Yes [provider]  fluticasone (FLONASE) 50 MCG/ACT nasal spray Place 2 sprays into both nostrils daily.   Yes [provider]  metoprolol (LOPRESSOR) 50 MG tablet Take 1 tablet (50 mg total) by mouth 2 (two) times daily. 12/05/14  Yes Carlena Bjornstad, MD  mirtazapine (REMERON) 15 MG tablet Take 15 mg by mouth at bedtime.   Yes [provider]  Multiple Vitamins-Minerals (ICAPS AREDS FORMULA PO) Take 1 capsule by mouth 2 (two) times daily.    Yes [provider]  zinc oxide (BALMEX) 11.3 % CREA cream Apply 1 application topically daily as needed.    Yes [provider]    Scheduled Meds: . [START ON 09/03/2017] digoxin  0.125 mg Oral QODAY  . escitalopram  10 mg Oral QHS  . metoprolol tartrate  50 mg Oral BID  . mirtazapine  15 mg Oral QHS   Infusions: . sodium chloride 100 mL/hr at 09/01/17 2306   PRN Meds:    Allergies as of 09/01/2017 - Review Complete 09/01/2017  Allergen Reaction Noted  . Donepezil Nausea And Vomiting 11/21/2014    Family History  Problem Relation Age of Onset  . Hypertension Mother   . Heart disease Mother        heart attack  . Heart disease Unknown   . Hypertension Unknown     Social History   Socioeconomic History  . Marital status: Widowed    Spouse name: Not on file  . Number of children: Not on file  . Years of education: Not on file  . Highest education level: Not on file  Occupational History  . Not on file  Social Needs  . Financial resource strain: Not on file  . Food insecurity:    Worry: Not on file    Inability: Not on file  . Transportation needs:    Medical: Not on file    Non-medical: Not on file  Tobacco Use  . Smoking status: Former Research scientist (life sciences)  . Smokeless tobacco: Never Used  Substance and Sexual Activity  . Alcohol use: Yes    Alcohol/week:  0.0 oz    Comment: occasional  . Drug use: No  . Sexual activity: Not on file  Lifestyle  . Physical activity:    Days per week: Not on file    Minutes per session: Not on file  . Stress: Not on file  Relationships  . Social connections:    Talks on phone: Not on file    Gets together: Not on file  Attends religious service: Not on file    Active member of club or organization: Not on file    Attends meetings of clubs or organizations: Not on file    Relationship status: Not on file  . Intimate partner violence:    Fear of current or ex partner: Not on file    Emotionally abused: Not on file    Physically abused: Not on file    Forced sexual activity: Not on file  Other Topics Concern  . Not on file  Social History Narrative  . Not on file    REVIEW OF SYSTEMS: Constitutional: Weakness, fatigue. ENT:  No nose bleeds Pulm: No trouble breathing.  No cough. CV:  No palpitations, no LE edema.  GU:  No hematuria, no frequency GI:  Per HPI Heme: No reports of unusual bleeding or bruising. Transfusions:  Per HPI Neuro:  No headaches, no peripheral tingling or numbness Psych: Agitated overnight or this morning but has since calmed down. Derm:  No itching, no rash or sores.  Endocrine:  No sweats or chills.  No polyuria or dysuria Immunization: Reviewed. Travel:  None beyond local counties in last few months.    PHYSICAL EXAM: Vital signs in last 24 hours: Vitals:   09/01/17 2300 09/02/17 0639  BP: (!) 117/91 138/60  Pulse: 96 97  Resp: 20 16  Temp: 98 F (36.7 C) 98.5 F (36.9 C)  SpO2: 100% 95%   Wt Readings from Last 3 Encounters:  09/01/17 105 lb (47.6 kg)  05/31/17 136 lb (61.7 kg)  06/10/16 126 lb 3.2 oz (57.2 kg)    General: Calm, pleasant, demented elderly AAF.  Considering her age and her multiple diagnoses she actually looks good. Head: No facial asymmetry or swelling.  No signs of head trauma. Eyes: No scleral icterus.  No conjunctival pallor.   EOMI. Ears: Not hard of hearing. Nose: No congestion or discharge. Mouth: Oral mucosa moist, pink, clear.  All but 2 teeth are missing.  Tongue midline. Neck: No JVD, no masses, no thyromegaly. Lungs: Clear bilaterally.  No cough.  No labored breathing. Heart: RRR.  No MRG.  S1, S2 present. Abdomen: Soft.  Nontender.  Nondistended.  Active bowel sounds.  No bruits, hernias, masses, HSM.Marland Kitchen   Rectal: Deferred.  DRE yesterday with soft brown stool which tested FOBT positive Musc/Skeltl: No joint redness or swelling.  No significant deformities or contractures. Extremities: Status post right BKA, surgical incision looks fine.  Warm, nonedematous left foot Neurologic: Patient follows commands.  She is demented.  She cannot tell me where she is, the date or the year, cannot tell me her daughter's name.  Moves all 4 limbs. Skin: No suspicious lesions.  No rash.  No significant bruising or purpura. Tattoos: None Nodes: No cervical adenopathy. Psych: Calm, cooperative, pleasant.  Intake/Output from previous day: 07/10 0701 - 07/11 0700 In: 779.7 [I.V.:409.7; Blood:370] Out: 0  Intake/Output this shift: No intake/output data recorded.  LAB RESULTS: Recent Labs    09/01/17 1134 09/02/17 0046  WBC 6.7  --   HGB 5.7* 8.4*  HCT 24.5* 31.2*  PLT 325  --    BMET Lab Results  Component Value Date   NA 147 (H) 09/01/2017   NA 143 06/10/2016   NA 144 05/31/2013   K 3.2 (L) 09/01/2017   K 4.7 06/10/2016   K 4.2 05/31/2013   CL 116 (H) 09/01/2017   CL 104 06/10/2016   CL 105 05/31/2013   CO2 26 09/01/2017  CO2 24 06/10/2016   CO2 18 (L) 05/31/2013   GLUCOSE 141 (H) 09/01/2017   GLUCOSE 82 06/10/2016   GLUCOSE 133 (H) 05/31/2013   BUN 11 09/01/2017   BUN 19 06/10/2016   BUN 31 (H) 05/31/2013   CREATININE 0.87 09/01/2017   CREATININE 1.17 (H) 06/10/2016   CREATININE 1.20 (H) 05/31/2013   CALCIUM 8.0 (L) 09/01/2017   CALCIUM 9.3 06/10/2016   CALCIUM 9.6 05/31/2013   LFT Recent  Labs    09/01/17 1134  PROT 4.8*  ALBUMIN 2.2*  AST 22  ALT 12  ALKPHOS 59  BILITOT 0.7   PT/INR Lab Results  Component Value Date   INR 1.34 09/01/2017   INR WILL FOLLOW 06/10/2016   INR 1.27 11/25/2010   Hepatitis Panel No results for input(s): HEPBSAG, HCVAB, HEPAIGM, HEPBIGM in the last 72 hours. C-Diff No components found for: CDIFF Lipase     Component Value Date/Time   LIPASE 29 05/31/2013 2050    Drugs of Abuse  No results found for: LABOPIA, COCAINSCRNUR, LABBENZ, AMPHETMU, THCU, LABBARB   RADIOLOGY STUDIES: No results found.   IMPRESSION:   *   FOBT + anemia Hx adenomatous colon polyps in 2012, 1 with HGD.  No follow up colonoscopy since then Excellent response to 2 U PRBCs.    *    Chronic A fib, remote CVA.  S/p pacemaker.  Chronic Eliquis.  Last dose 7/10 in AM.    *    Dysphagia.  On nectar thick liquids and puree diet.     *     Dementia.    *   PVD.  S/p right AKA 08/2015.      PLAN:     *  CT abdomen and pelvis with contrast.  Needs follow up of the peritoneal and omental masses seen in 2012  *   Repeat CBC in AM.    *   Colonoscopy, possible EGD tomorrow @ 1300.   Spoke with the patient's daughter over the phone, Ruel Favors [phone].  Discussed potential hazards/risks of colonoscopy and possible EGD.  Daughter is agreeable to proceed.  Also explained that the patient will need to have a temporary feeding/NG  tube placed in order to allow for prep.  Given her dysphagia and need for thickened liquids there is no way she will be able to consume oral bowel prep by swallowing.   Azucena Freed  09/02/2017, 8:18 AM Phone [phone]   Attending physician's note   I have taken a history, examined the patient and reviewed the chart. I agree with the Advanced Practitioner's note, impression and recommendations. 84 year old female with multiple comorbidities and dementia admitted with worsening anemia.  Has ? peritoneal and omental  lesions noted on CT abdomen in 2012 and had an advanced tubulovillous adenoma with high-grade dysplasia removed in 2012 and has not had any surveillance colonoscopy or follow-up after that. According to primary team, PMD and her daughter do not want to pursue any invasive procedure or CT abdomen and pelvis. Continue supportive care Please call if you have any questions or concerns  K. Denzil Magnuson , MD [phone]

## 2017-09-02 NOTE — Progress Notes (Signed)
RN and NT unable to get accurate EKG because patient is confuse and agitated. M.D. Informed and cancelled EKG at this time. Will continue to monitor.

## 2017-09-02 NOTE — Progress Notes (Signed)
Received a call from nurse at 4:58pm that patient was agitated and had taken off her tele monitor. I went up to examine the patient. She was tearful and kept saying "I'm tired" but however was able to respond to questions. She calmed down after several minutes. She was placed on delirium precautions and nurse will attempt to place Tele monitor.

## 2017-09-02 NOTE — Progress Notes (Addendum)
Date: 09/02/2017  Patient name: Brooke Griffin  Medical record number: [id number]  Date of birth: [birth year]   I have seen and evaluated Brooke Griffin and discussed their care with the Residency Team. Brooke Griffin is an 84 yo female with advanced dementia A. fib on Eliquis, and a pt [facility].  She presents with symptomatic anemia.  Her most recent hemoglobin was 10.4 and September 2018.  Brooke Griffin was in a SNF for 10 days for respite care.  When her daughter picked her up, she noticed that her mother was lethargic, somnolent, and weak all over.  Her PCP at [facility] check the hemoglobin and it was 6.1 and the patient was brought to the emergency department.  At some point, GI was consulted who recommended an NG tube as the patient would not be able to swallow the prep, colonoscopy, possible EGD, and CT of the abdomen with contrast.  Due to the patient's advanced dementia, I felt this was a bit aggressive and spoke with the [facility]'s P[facility] who agrees the patient will not benefit from any invasive procedures and that they can simply watch the hemoglobin and transfuse if needed.  I have since canceled GIs order for an NG, endoscopy, and a CT scan.  Dr. Eileen Stanford and I spoke to the patient's daughter at the bedside and explained the aggressiveness of the recommended work-up and the alternative option of following the hemoglobin and transfusing as needed.  The daughter is in agreement to halt all further invasive procedures.  Vitals:   09/02/17 0639 09/02/17 0921  BP: 138/60 (!) 139/95  Pulse: 97 (!) 101  Resp: 16 17  Temp: 98.5 F (36.9 C) 97.8 F (36.6 C)  SpO2: 95% 98%  Elderly female in NAD H irr irr no murmur Or hypertensive on admission, this is likely a slow GI bleed L clear anteriorly ABD + BS, soft, NT Heme + in ED with brown stool  HgB 10.4 in Sep 2018 - 5.7 on admit - 2 units PRBC - 8.4 - 8.3  CT 2012 ABD: Multiple soft tissue attenuating nodules throughout the peritoneal cavity and omental  fat which is suspicious for spread of an unknown tumor.  CT lungs 2012: No worrisome nodules or masses   Colonoscopy 2012: Multiple colon polyps and diverticulosis  Assessment and Plan: I have seen and evaluated the patient as outlined above. I agree with the formulated Assessment and Plan as detailed in the residents' note, with the following changes: Brooke Griffin is an 84 yo female with advanced dementia, A. fib on Eliquis, and a [facility] of [facility] who presented with symptomatic anemia.  She had a 4 g drop in hemoglobin from September until admission.  Since she was not hypotensive or tachycardic on admission, this is likely a slow GI bleed, exacerbated by her Eliquis.  A colonoscopy would have clarified her bleeding risk in regards to her Eliquis but would have subjected a severely demented woman to undue risk with dubious benefits.  The daughter and the patient's PCP at [facility] are all in agreement with a less aggressive approach with frequent hemoglobin checks and transfusion of packed red blood cells as needed.  We will recheck the hemoglobin in the morning and if stable, the daughter is prepared to take her mother back home.  She will resume her normal diet.  We will not send the patient home on Eliquis until at minimum we have longer-term stability of her hemoglobin.  The 2012 CT scan which showed findings  concerning for omental spread of a malignancy will not be followed up on as the patient is not able to swallow oral contrast and the patient is not a candidate for any invasive surgery.  Additionally, it has been 7 years since that CT scan with an uneventful course. We are also checking for other etiologies of he anemia - destruction and decreased production.  1.  Cancel NG, CT scan, colonoscopy 2.  Hemoglobin in the morning 3.  Likely DC off Eliquis in the morning to her daughter's care 4.  Carrie at [facility] has been updated on the plan and is in agreement  Bartholomew Crews, MD 7/11/20191:12 PM

## 2017-09-03 ENCOUNTER — Encounter (HOSPITAL_COMMUNITY)
Admission: EM | Disposition: A | Discharge status: Home / Self Care | Payer: Self-pay | Source: Ambulatory Visit | Attending: Internal Medicine

## 2017-09-03 DIAGNOSIS — Z888 Allergy status to other drugs, medicaments and biological substances status: Secondary | ICD-10-CM

## 2017-09-03 DIAGNOSIS — R600 Localized edema: Secondary | ICD-10-CM

## 2017-09-03 LAB — CBC
HCT: 30.3 % — ABNORMAL LOW (ref 36.0–46.0)
HEMOGLOBIN: 8.3 g/dL — AB (ref 12.0–15.0)
MCH: 21.2 pg — AB (ref 26.0–34.0)
MCHC: 27.4 g/dL — AB (ref 30.0–36.0)
MCV: 77.3 fL — AB (ref 78.0–100.0)
Platelets: 274 10*3/uL (ref 150–400)
RBC: 3.92 MIL/uL (ref 3.87–5.11)
RDW: 26.3 % — ABNORMAL HIGH (ref 11.5–15.5)
WBC: 7.2 10*3/uL (ref 4.0–10.5)

## 2017-09-03 SURGERY — COLONOSCOPY WITH PROPOFOL
Anesthesia: Monitor Anesthesia Care

## 2017-09-03 NOTE — Progress Notes (Signed)
Pt discharge instructions given, pts daughter verbalized understanding.  Pt is pleasantly confused with hx of dementia.  VSS.  Denies pain. Pt left floor via wheelchair accompanied by staff and family to home with daughter.

## 2017-09-03 NOTE — Care Management Important Message (Signed)
Important Message  Patient Details  Name: Brooke Griffin MRN: [id number] Date of Birth: 1925/05/10   Medicare Important Message Given:  Yes    Jenniferann Stuckert Montine Circle 09/03/2017, 3:38 PM

## 2017-09-03 NOTE — Discharge Summary (Signed)
Name: Brooke Griffin MRN: [id number] DOB: 1925/10/19 84 y.o. PCP: [facility] [place]  Date of Admission: 09/01/2017 11:25 AM Date of Discharge: 09/03/2017 Attending Physician: Dr. Larey Dresser, MD  Discharge Diagnosis: 1.  Symptomatic anemia 2.  Atrial fibrillation status post pacemaker 3. Chronic right upper extremity edema  Discharge Medications: Allergies as of 09/03/2017      Reactions   Donepezil Nausea And Vomiting      Medication List    STOP taking these medications   apixaban 2.5 MG Tabs tablet Commonly known as:  ELIQUIS     TAKE these medications   acetaminophen 500 MG tablet Commonly known as:  TYLENOL Take 1,000 mg by mouth every 6 (six) hours as needed for mild pain or moderate pain.   digoxin 0.125 MG tablet Commonly known as:  LANOXIN Take 1 tablet (0.125 mg total) by mouth every other day.   ergocalciferol 50000 units capsule Commonly known as:  VITAMIN D2 Take 50,000 Units by mouth once a week.   escitalopram 10 MG tablet Commonly known as:  LEXAPRO Take 10 mg by mouth at bedtime.   fluticasone 50 MCG/ACT nasal spray Commonly known as:  FLONASE Place 2 sprays into both nostrils daily.   ICAPS AREDS FORMULA PO Take 1 capsule by mouth 2 (two) times daily.   metoprolol tartrate 50 MG tablet Commonly known as:  LOPRESSOR Take 1 tablet (50 mg total) by mouth 2 (two) times daily.   mirtazapine 15 MG tablet Commonly known as:  REMERON Take 15 mg by mouth at bedtime.   zinc oxide 11.3 % Crea cream Commonly known as:  BALMEX Apply 1 application topically daily as needed.       Disposition and follow-up:   Brooke Griffin was discharged from [facility] in [place] condition.  At the hospital follow up visit please address:  1.  Symptomatic anemia- presented with hemoglobin of 5.7 received 2 packed red blood cell transfusion.  Hemoglobin at discharge was 8.3.  2.  Labs / imaging needed at  time of follow-up: CBC  3.  Pending labs/ test needing follow-up: None  Follow-up Appointments: [facility] [place] [facility] Course by problem list: 1.  Symptomatic anemia Microcytic anemia versus anemia of chronic disease versus GI blood loss anemia [name] is an 84 year old female with past medical history significant for diverticulosis, she is status post colonoscopy with snare polypectomy in 2012, advanced dementia, PAD, atrial fibrillation with pacemaker placement 20 years ago, hypertension, CVA 20 years ago, scoliosis and spinal stenosis.  She presented to the emergency department accompanied by her daughter on 09/01/2017 with general weakness and lethargy.  In the ED her initial hemoglobin was 5.7 from a baseline of 12.7 in April 2018.  She was also found to have guaiac positive stool.  She was transfused 2 units of packed red blood cells and IV resuscitation with normal saline and lactate Ringer's.  Her posttransfusion hemoglobin of 8.4.  There were no reported history of melena, hematochezia, bright red blood per rectum, hemoptysis, hematuria.  Gastroenterology was consulted and recommendedCT abdomen and pelvis with contrast via NG tube swallow prep, colonoscopy and possible EGD given patient's history of peritoneal and omental lesions noted on CT abdomen in 2012 and advanced tubulovillous adenoma with high-grade dysplasia which was removed in 2012.   However, patient has advanced dementia at baseline and given the invasive nature of these procedures, we had a discussion with patient and her daughter.Decision was  made to conservatively follow patient's hemoglobin and transfuse as needed. Her PCP at [facility] will have regular monitoring of hemoglobin and transfuse as needed.   Her iron panel revealed a low iron of 24, low TIBC of 246, low saturation rate. CBC indicated an increased RDW which suggests mixed anemia of chronic disease and iron deficiency anemia.  2.  Atrial fibrillation status  post pacemaker Ms. Donica has a long history of atrial fibrillation with pacemaker placement 20 years ago.  Pacemaker is located on her right upper chest.  During this admission her home Eliquis 2.5 mg was held due to anemia and possible GI blood loss.  Her home dose of metoprolol was reduced from 50 mg twice daily to 25 mg twice daily with HOLD cautions given that she had several episodes of bradycardia.  Her home digoxin 0.125 mg administered every other day was continued.  3.  Hypokalemia It was noted that [name] was hypokalemic on admission to 3.2 however after receiving 60 mEq of K-Dur, K+ corrected to 3.6  Discharge Vitals:   BP (!) 145/67 (BP Location: Right Arm)   Pulse 84   Temp (!) 97.5 F (36.4 C) (Oral)   Resp 18   Ht 5\' 3"  (1.6 m)   Wt 105 lb (47.6 kg)   SpO2 100%   BMI 18.60 kg/m   Pertinent Labs, Studies, and Procedures:  Results for JOURNEE, BOBROWSKI (MRN [id number]) as of 09/03/2017 13:13  Ref. Range 09/02/2017 00:46 09/02/2017 07:52 09/03/2017 04:55  WBC Latest Ref Range: 4.0 - 10.5 K/uL  7.6 7.2  RBC Latest Ref Range: 3.87 - 5.11 MIL/uL  4.06 3.92  Hemoglobin Latest Ref Range: 12.0 - 15.0 g/dL 8.4 (L) 8.3 (L) 8.3 (L)  HCT Latest Ref Range: 36.0 - 46.0 % 31.2 (L) 30.8 (L) 30.3 (L)  MCV Latest Ref Range: 78.0 - 100.0 fL  75.9 (L) 77.3 (L)  MCH Latest Ref Range: 26.0 - 34.0 pg  20.4 (L) 21.2 (L)  MCHC Latest Ref Range: 30.0 - 36.0 g/dL  26.9 (L) 27.4 (L)  RDW Latest Ref Range: 11.5 - 15.5 %  25.2 (H) 26.3 (H)  Platelets Latest Ref Range: 150 - 400 K/uL  261 274   Results for ZEPHANIAH, ENYEART (MRN [id number]) as of 09/03/2017 13:13  Ref. Range 09/02/2017 00:46 09/02/2017 07:52 09/03/2017 04:55  Iron Latest Ref Range: 28 - 170 ug/dL  24 (L)   UIBC Latest Units: ug/dL  222   TIBC Latest Ref Range: 250 - 450 ug/dL  246 (L)   Saturation Ratios Latest Ref Range: 10.4 - 31.8 %  10 (L)   Ferritin Latest Ref Range: 11 - 307 ng/mL  20    Results for ANN-MARIE, KLUGE (MRN [id number])  as of 09/03/2017 13:13  Ref. Range 09/02/2017 07:52 09/03/2017 04:55  RBC. Latest Ref Range: 3.87 - 5.11 MIL/uL 4.06   Retic Ct Pct Latest Ref Range: 0.4 - 3.1 % 0.8   Retic Count, Absolute Latest Ref Range: 19.0 - 186.0 K/uL 32.5    Results for JOHNATHON, MITTAL (MRN [id number]) as of 09/03/2017 13:13  Ref. Range 09/02/2017 00:46 09/02/2017 07:52 09/03/2017 04:55  LDH Latest Ref Range: 98 - 192 U/L  205 (H)    Results for GIZELL, DANSER (MRN [id number]) as of 09/03/2017 13:13  Ref. Range 09/02/2017 07:52 09/03/2017 04:55  Digoxin, Serum Latest Ref Range: 0.8 - 2.0 ng/mL 0.3 (L)    Discharge Instructions: Discharge Instructions    Call MD for:  difficulty breathing, headache or  visual disturbances   Complete by:  As directed    Call MD for:  difficulty breathing, headache or visual disturbances   Complete by:  As directed    Call MD for:  extreme fatigue   Complete by:  As directed    Call MD for:  extreme fatigue   Complete by:  As directed    Call MD for:  hives   Complete by:  As directed    Call MD for:  hives   Complete by:  As directed    Call MD for:  persistant dizziness or light-headedness   Complete by:  As directed    Call MD for:  persistant dizziness or light-headedness   Complete by:  As directed    Call MD for:  persistant nausea and vomiting   Complete by:  As directed    Call MD for:  persistant nausea and vomiting   Complete by:  As directed    Call MD for:  redness, tenderness, or signs of infection (pain, swelling, redness, odor or green/yellow discharge around incision site)   Complete by:  As directed    Call MD for:  redness, tenderness, or signs of infection (pain, swelling, redness, odor or green/yellow discharge around incision site)   Complete by:  As directed    Call MD for:  severe uncontrolled pain   Complete by:  As directed    Call MD for:  severe uncontrolled pain   Complete by:  As directed    Call MD for:  temperature >100.4   Complete by:  As  directed    Call MD for:  temperature >100.4   Complete by:  As directed    Diet - low sodium heart healthy   Complete by:  As directed    Diet - low sodium heart healthy   Complete by:  As directed    Discharge instructions   Complete by:  As directed    We have stopped your eliquis. Please follow up with [facility] who will check your blood levels regularly to make sure they don't drop.   Discharge instructions   Complete by:  As directed    You were seen at the hospital because of weakness. Your blood level showed anemia and you received 2 Units of blood transfusion which helped with your blood count   Your primary care doctor at [facility] will do frequent blood draws to monitor your blood level   Increase activity slowly   Complete by:  As directed    Increase activity slowly   Complete by:  As directed       Signed: Jean Rosenthal, MD 09/03/2017, 1:00 PM   Pager: [phone]

## 2017-09-03 NOTE — Progress Notes (Signed)
Called pt's daughter [name] and advised her mother was being discharged.  [name] stated she would be on her way.

## 2017-09-03 NOTE — Progress Notes (Signed)
   Subjective: Hospital day 2  Overnight: Tachycardic to 105, resolved with no intervention  Today, Brooke Griffin was examined at bedside and had no complaints.  She had not yet had breakfast but report of a good appetite.  She denied any chest pain, palpitation, shortness of breath, headaches, abdominal pain.  Objective:  Vital signs in last 24 hours: Vitals:   09/02/17 0639 09/02/17 0921 09/02/17 2118 09/03/17 0627  BP: 138/60 (!) 139/95 123/81 129/79  Pulse: 97 (!) 101 (!) 105 98  Resp: 16 17 16 16   Temp: 98.5 F (36.9 C) 97.8 F (36.6 C) 98 F (36.7 C) 97.6 F (36.4 C)  TempSrc:  Oral    SpO2: 95% 98% 96% 96%  Weight:      Height:       Physical exams: Constitutional: In no acute distress, lying in bed, appropriately answers to assessment questions HEENT: Normocephalic, atraumatic Cardiovascular: Irregular irregular on auscultation and palpation of radial pulses Pulmonary: Clear to auscultation bilaterally Abdomen: Soft, nondistended, nontender to palpation  Assessment/Plan:  Active Problems:   Symptomatic anemia  Brooke Griffin is an 84 year old female with a past medical history significant for diverticulosis status post colonoscopy with snare polypectomy(x8) on 09/05/2010,dementia, PAD, A. fib with pacemaker placement 20 years ago, hypertension, CVA 20 years ago, scoliosis and spinal stenosis who presented to the ED accompanied by daughterandfound to generally weak.    Symptomatic anemia(Microcytic vs anemia of chronic disease vsGI blood loss anemia) History of diverticulosis Patient's daughter received a call from her PCP at [facility] indicating the hemoglobin was 6.1. Previously she had spent 10 days in a skilled nursing facility however there wereno reports of melena, hematochezia, bright red blood per rectum, frank blood diarrheaorhemoptysis.Patient has been lethargic, somnolent, haddecreased appetite for 3 days now.In the ED her vitals stable, hemoglobin was  5.7 from baseline of 12.7 on 06/10/2016 and FOBT positive.  -Status post 2 packed red blood cell transfusion -H&H stable 8.3<--8.3 <--8.4 -GILaBruer:  Recommended CT abdomen and pelvis with contrast via NG tube swallow prep, colonoscopy and possible EGD given patient's history of peritoneal and omental lesions noted on CT abdomen in 2012 and advanced tubulovillous adenoma with high-grade dysplasia which was removed in 2012. Patient has advanced dementia at baseline and given the invasive nature of these procedures, we had a discussion with patient and her daughter.  Decision was made to conservatively follow patient's hemoglobin and transfuse as needed.  Her PCP at [facility] will have regular monitoring of hemoglobin. -Low iron of 24, low TIBC of 246, low saturation rate, increased RDW.  Indicating possible mixed anemia of chronic disease and iron deficiency anemia.  Right Upper extremity edema, chronic, improved -Denies any history of surgery however daughter states that she tends to sleep on her right arm.    Atrial fibrillation with pacemaker -Continue metoprolol tartrate 25 mg p.o. twice daily with hold parameters for heart rate less than 65. -Hold Eliquis -Digoxin level on 09/02/2017 of 0.3 -Start home digoxin 0.125 mg every other day(first dose 09/03/17)  Hypokalemia  -K on admission 3.2, currently normal at 3.6 -Status post K-DurKDur 59mEq -Will continue to follow   FEN: Normal saline, replace electrolytes as needed, liquid diet VTE prophylaxis: SCD CODE STATUS: DNR  Dispo: Admit patient toinpatient with expected discharge today.    Jean Rosenthal, MD 09/03/2017, 6:41 AM Pager: [phone]

## 2017-09-09 ENCOUNTER — Other Ambulatory Visit (HOSPITAL_COMMUNITY): Payer: Self-pay | Admitting: Gerontology

## 2017-09-09 ENCOUNTER — Ambulatory Visit (HOSPITAL_COMMUNITY)
Admission: RE | Admit: 2017-09-09 | Discharge: 2017-09-09 | Disposition: A | Discharge status: Home / Self Care | Payer: Medicare (Managed Care) | Source: Ambulatory Visit | Attending: Gerontology | Admitting: Gerontology

## 2017-09-09 DIAGNOSIS — Z8709 Personal history of other diseases of the respiratory system: Secondary | ICD-10-CM

## 2017-09-09 DIAGNOSIS — J449 Chronic obstructive pulmonary disease, unspecified: Secondary | ICD-10-CM | POA: Insufficient documentation

## 2017-09-09 DIAGNOSIS — R0989 Other specified symptoms and signs involving the circulatory and respiratory systems: Secondary | ICD-10-CM

## 2017-09-09 DIAGNOSIS — J90 Pleural effusion, not elsewhere classified: Secondary | ICD-10-CM | POA: Insufficient documentation

## 2017-09-09 DIAGNOSIS — R918 Other nonspecific abnormal finding of lung field: Secondary | ICD-10-CM | POA: Diagnosis not present

## 2017-09-17 ENCOUNTER — Encounter: Payer: Self-pay | Admitting: Cardiology

## 2017-09-28 ENCOUNTER — Ambulatory Visit
Admission: RE | Admit: 2017-09-28 | Discharge: 2017-09-28 | Disposition: A | Discharge status: Home / Self Care | Payer: Medicare (Managed Care) | Source: Ambulatory Visit | Attending: Gerontology | Admitting: Gerontology

## 2017-09-28 ENCOUNTER — Other Ambulatory Visit: Payer: Self-pay | Admitting: Gerontology

## 2017-09-28 DIAGNOSIS — J309 Allergic rhinitis, unspecified: Secondary | ICD-10-CM

## 2017-09-28 DIAGNOSIS — H6123 Impacted cerumen, bilateral: Secondary | ICD-10-CM

## 2017-09-28 DIAGNOSIS — Z853 Personal history of malignant neoplasm of breast: Secondary | ICD-10-CM

## 2017-10-27 ENCOUNTER — Other Ambulatory Visit (HOSPITAL_COMMUNITY): Payer: Self-pay | Admitting: Gerontology

## 2017-10-27 ENCOUNTER — Ambulatory Visit (HOSPITAL_COMMUNITY)
Admission: RE | Admit: 2017-10-27 | Discharge: 2017-10-27 | Disposition: A | Discharge status: Home / Self Care | Payer: Medicare (Managed Care) | Source: Ambulatory Visit | Attending: Gerontology | Admitting: Gerontology

## 2017-10-27 DIAGNOSIS — J90 Pleural effusion, not elsewhere classified: Secondary | ICD-10-CM | POA: Insufficient documentation

## 2017-10-27 DIAGNOSIS — R0989 Other specified symptoms and signs involving the circulatory and respiratory systems: Secondary | ICD-10-CM

## 2017-10-27 DIAGNOSIS — J9811 Atelectasis: Secondary | ICD-10-CM | POA: Diagnosis not present

## 2017-10-27 DIAGNOSIS — J449 Chronic obstructive pulmonary disease, unspecified: Secondary | ICD-10-CM | POA: Insufficient documentation

## 2017-10-27 DIAGNOSIS — R609 Edema, unspecified: Secondary | ICD-10-CM | POA: Diagnosis not present

## 2017-10-27 DIAGNOSIS — F17221 Nicotine dependence, chewing tobacco, in remission: Secondary | ICD-10-CM | POA: Diagnosis present

## 2017-12-06 ENCOUNTER — Emergency Department (HOSPITAL_COMMUNITY): Payer: Medicare (Managed Care)

## 2017-12-06 ENCOUNTER — Encounter (HOSPITAL_COMMUNITY): Payer: Self-pay

## 2017-12-06 ENCOUNTER — Inpatient Hospital Stay (HOSPITAL_COMMUNITY)
Admission: EM | Admit: 2017-12-06 | Discharge: 2017-12-10 | DRG: 871 | Disposition: A | Discharge status: Hospice | Payer: Medicare (Managed Care) | Attending: Family Medicine | Admitting: Family Medicine

## 2017-12-06 ENCOUNTER — Other Ambulatory Visit: Payer: Self-pay

## 2017-12-06 DIAGNOSIS — Z90710 Acquired absence of both cervix and uterus: Secondary | ICD-10-CM

## 2017-12-06 DIAGNOSIS — I739 Peripheral vascular disease, unspecified: Secondary | ICD-10-CM | POA: Diagnosis present

## 2017-12-06 DIAGNOSIS — E876 Hypokalemia: Secondary | ICD-10-CM | POA: Diagnosis not present

## 2017-12-06 DIAGNOSIS — Z89511 Acquired absence of right leg below knee: Secondary | ICD-10-CM

## 2017-12-06 DIAGNOSIS — Z8249 Family history of ischemic heart disease and other diseases of the circulatory system: Secondary | ICD-10-CM

## 2017-12-06 DIAGNOSIS — Z87891 Personal history of nicotine dependence: Secondary | ICD-10-CM

## 2017-12-06 DIAGNOSIS — D500 Iron deficiency anemia secondary to blood loss (chronic): Secondary | ICD-10-CM | POA: Diagnosis not present

## 2017-12-06 DIAGNOSIS — I10 Essential (primary) hypertension: Secondary | ICD-10-CM | POA: Diagnosis present

## 2017-12-06 DIAGNOSIS — F329 Major depressive disorder, single episode, unspecified: Secondary | ICD-10-CM | POA: Diagnosis present

## 2017-12-06 DIAGNOSIS — E870 Hyperosmolality and hypernatremia: Secondary | ICD-10-CM | POA: Diagnosis present

## 2017-12-06 DIAGNOSIS — R1110 Vomiting, unspecified: Secondary | ICD-10-CM | POA: Diagnosis present

## 2017-12-06 DIAGNOSIS — A419 Sepsis, unspecified organism: Principal | ICD-10-CM | POA: Diagnosis present

## 2017-12-06 DIAGNOSIS — Z8673 Personal history of transient ischemic attack (TIA), and cerebral infarction without residual deficits: Secondary | ICD-10-CM

## 2017-12-06 DIAGNOSIS — I4821 Permanent atrial fibrillation: Secondary | ICD-10-CM | POA: Diagnosis present

## 2017-12-06 DIAGNOSIS — N390 Urinary tract infection, site not specified: Secondary | ICD-10-CM | POA: Diagnosis present

## 2017-12-06 DIAGNOSIS — R112 Nausea with vomiting, unspecified: Secondary | ICD-10-CM

## 2017-12-06 DIAGNOSIS — Z950 Presence of cardiac pacemaker: Secondary | ICD-10-CM

## 2017-12-06 DIAGNOSIS — Z7189 Other specified counseling: Secondary | ICD-10-CM | POA: Diagnosis not present

## 2017-12-06 DIAGNOSIS — F0280 Dementia in other diseases classified elsewhere without behavioral disturbance: Secondary | ICD-10-CM | POA: Diagnosis not present

## 2017-12-06 DIAGNOSIS — J181 Lobar pneumonia, unspecified organism: Secondary | ICD-10-CM

## 2017-12-06 DIAGNOSIS — Z66 Do not resuscitate: Secondary | ICD-10-CM | POA: Diagnosis present

## 2017-12-06 DIAGNOSIS — R6520 Severe sepsis without septic shock: Secondary | ICD-10-CM | POA: Diagnosis present

## 2017-12-06 DIAGNOSIS — F0390 Unspecified dementia without behavioral disturbance: Secondary | ICD-10-CM | POA: Diagnosis present

## 2017-12-06 DIAGNOSIS — Z515 Encounter for palliative care: Secondary | ICD-10-CM | POA: Diagnosis not present

## 2017-12-06 DIAGNOSIS — R4182 Altered mental status, unspecified: Secondary | ICD-10-CM

## 2017-12-06 DIAGNOSIS — J690 Pneumonitis due to inhalation of food and vomit: Secondary | ICD-10-CM | POA: Diagnosis present

## 2017-12-06 DIAGNOSIS — E860 Dehydration: Secondary | ICD-10-CM | POA: Diagnosis present

## 2017-12-06 DIAGNOSIS — Z993 Dependence on wheelchair: Secondary | ICD-10-CM

## 2017-12-06 DIAGNOSIS — F03C0 Unspecified dementia, severe, without behavioral disturbance, psychotic disturbance, mood disturbance, and anxiety: Secondary | ICD-10-CM | POA: Diagnosis present

## 2017-12-06 DIAGNOSIS — R5381 Other malaise: Secondary | ICD-10-CM | POA: Diagnosis present

## 2017-12-06 DIAGNOSIS — D649 Anemia, unspecified: Secondary | ICD-10-CM | POA: Diagnosis present

## 2017-12-06 DIAGNOSIS — I4891 Unspecified atrial fibrillation: Secondary | ICD-10-CM | POA: Diagnosis present

## 2017-12-06 DIAGNOSIS — B952 Enterococcus as the cause of diseases classified elsewhere: Secondary | ICD-10-CM | POA: Diagnosis present

## 2017-12-06 DIAGNOSIS — I482 Chronic atrial fibrillation, unspecified: Secondary | ICD-10-CM | POA: Diagnosis not present

## 2017-12-06 DIAGNOSIS — Z9049 Acquired absence of other specified parts of digestive tract: Secondary | ICD-10-CM | POA: Diagnosis not present

## 2017-12-06 DIAGNOSIS — Z79899 Other long term (current) drug therapy: Secondary | ICD-10-CM

## 2017-12-06 DIAGNOSIS — E8809 Other disorders of plasma-protein metabolism, not elsewhere classified: Secondary | ICD-10-CM | POA: Diagnosis present

## 2017-12-06 DIAGNOSIS — E872 Acidosis: Secondary | ICD-10-CM | POA: Diagnosis present

## 2017-12-06 DIAGNOSIS — I2510 Atherosclerotic heart disease of native coronary artery without angina pectoris: Secondary | ICD-10-CM | POA: Diagnosis present

## 2017-12-06 DIAGNOSIS — J189 Pneumonia, unspecified organism: Secondary | ICD-10-CM

## 2017-12-06 LAB — BLOOD GAS, VENOUS
ACID-BASE EXCESS: 3.1 mmol/L — AB (ref 0.0–2.0)
Bicarbonate: 28.9 mmol/L — ABNORMAL HIGH (ref 20.0–28.0)
Drawn by: 257881
O2 SAT: 28.2 %
PATIENT TEMPERATURE: 98.6
pCO2, Ven: 54.5 mmHg (ref 44.0–60.0)
pH, Ven: 7.344 (ref 7.250–7.430)

## 2017-12-06 LAB — BASIC METABOLIC PANEL
Anion gap: 9 (ref 5–15)
BUN: 19 mg/dL (ref 8–23)
CALCIUM: 7.7 mg/dL — AB (ref 8.9–10.3)
CO2: 27 mmol/L (ref 22–32)
CREATININE: 0.9 mg/dL (ref 0.44–1.00)
Chloride: 121 mmol/L — ABNORMAL HIGH (ref 98–111)
GFR, EST NON AFRICAN AMERICAN: 55 mL/min — AB (ref >60)
GLUCOSE: 144 mg/dL — AB (ref 70–99)
Potassium: 2.5 mmol/L — CL (ref 3.5–5.1)
Sodium: 157 mmol/L — ABNORMAL HIGH (ref 135–145)

## 2017-12-06 LAB — COMPREHENSIVE METABOLIC PANEL
ALT: 10 U/L (ref 0–44)
ANION GAP: 9 (ref 5–15)
AST: 19 U/L (ref 15–41)
Albumin: 1.9 g/dL — ABNORMAL LOW (ref 3.5–5.0)
Alkaline Phosphatase: 63 U/L (ref 38–126)
BILIRUBIN TOTAL: 1.4 mg/dL — AB (ref 0.3–1.2)
BUN: 19 mg/dL (ref 8–23)
CO2: 29 mmol/L (ref 22–32)
Calcium: 7.9 mg/dL — ABNORMAL LOW (ref 8.9–10.3)
Chloride: 115 mmol/L — ABNORMAL HIGH (ref 98–111)
Creatinine, Ser: 0.95 mg/dL (ref 0.44–1.00)
GFR calc Af Amer: 60 mL/min — ABNORMAL LOW (ref >60)
GFR, EST NON AFRICAN AMERICAN: 52 mL/min — AB (ref >60)
Glucose, Bld: 118 mg/dL — ABNORMAL HIGH (ref 70–99)
Potassium: 2.5 mmol/L — CL (ref 3.5–5.1)
Sodium: 153 mmol/L — ABNORMAL HIGH (ref 135–145)
TOTAL PROTEIN: 5.8 g/dL — AB (ref 6.5–8.1)

## 2017-12-06 LAB — CBC WITH DIFFERENTIAL/PLATELET
Abs Immature Granulocytes: 0.17 10*3/uL — ABNORMAL HIGH (ref 0.00–0.07)
Basophils Absolute: 0 10*3/uL (ref 0.0–0.1)
Basophils Relative: 0 %
Eosinophils Absolute: 0 10*3/uL (ref 0.0–0.5)
Eosinophils Relative: 0 %
HEMATOCRIT: 31.9 % — AB (ref 36.0–46.0)
Hemoglobin: 8.4 g/dL — ABNORMAL LOW (ref 12.0–15.0)
Immature Granulocytes: 1 %
Lymphocytes Relative: 3 %
Lymphs Abs: 0.6 10*3/uL — ABNORMAL LOW (ref 0.7–4.0)
MCH: 22 pg — AB (ref 26.0–34.0)
MCHC: 26.3 g/dL — ABNORMAL LOW (ref 30.0–36.0)
MCV: 83.5 fL (ref 80.0–100.0)
Monocytes Absolute: 0.6 10*3/uL (ref 0.1–1.0)
Monocytes Relative: 3 %
NEUTROS ABS: 18.1 10*3/uL — AB (ref 1.7–7.7)
NEUTROS PCT: 93 %
PLATELETS: 472 10*3/uL — AB (ref 150–400)
RBC: 3.82 MIL/uL — ABNORMAL LOW (ref 3.87–5.11)
RDW: 18 % — AB (ref 11.5–15.5)
WBC: 19.5 10*3/uL — AB (ref 4.0–10.5)
nRBC: 0.3 % — ABNORMAL HIGH (ref 0.0–0.2)

## 2017-12-06 LAB — TSH: TSH: 2.168 u[IU]/mL (ref 0.350–4.500)

## 2017-12-06 LAB — PHOSPHORUS: Phosphorus: 2.3 mg/dL — ABNORMAL LOW (ref 2.5–4.6)

## 2017-12-06 LAB — CBG MONITORING, ED: Glucose-Capillary: 139 mg/dL — ABNORMAL HIGH (ref 70–99)

## 2017-12-06 LAB — I-STAT CG4 LACTIC ACID, ED: LACTIC ACID, VENOUS: 2.28 mmol/L — AB (ref 0.5–1.9)

## 2017-12-06 LAB — AMMONIA: Ammonia: 26 umol/L (ref 9–35)

## 2017-12-06 LAB — LACTIC ACID, PLASMA: LACTIC ACID, VENOUS: 2 mmol/L — AB (ref 0.5–1.9)

## 2017-12-06 LAB — MAGNESIUM: Magnesium: 2 mg/dL (ref 1.7–2.4)

## 2017-12-06 MED ORDER — POTASSIUM CHLORIDE 10 MEQ/100ML IV SOLN: 10.0000 meq | Freq: Once | INTRAVENOUS | Status: DC

## 2017-12-06 MED ORDER — METOPROLOL TARTRATE 5 MG/5ML IV SOLN
5.0000 mg | Freq: Once | INTRAVENOUS | Status: AC
  Administered 2017-12-06: 5 mg via INTRAVENOUS
  Filled 2017-12-06: qty 5

## 2017-12-06 MED ORDER — DIGOXIN 125 MCG PO TABS
0.1250 mg | ORAL_TABLET | ORAL | Status: DC
  Administered 2017-12-08 – 2017-12-10 (×2): 0.125 mg via ORAL
  Filled 2017-12-06 (×2): qty 1

## 2017-12-06 MED ORDER — SODIUM CHLORIDE 0.9 % IV BOLUS (SEPSIS)
1000.0000 mL | Freq: Once | INTRAVENOUS | Status: AC
  Administered 2017-12-06: 1000 mL via INTRAVENOUS

## 2017-12-06 MED ORDER — SODIUM CHLORIDE 0.9 % IV SOLN: 1.0000 g | INTRAVENOUS | Status: DC

## 2017-12-06 MED ORDER — KCL IN DEXTROSE-NACL 20-5-0.2 MEQ/L-%-% IV SOLN
INTRAVENOUS | Status: DC
  Administered 2017-12-06 – 2017-12-08 (×3): via INTRAVENOUS
  Filled 2017-12-06 (×4): qty 1000

## 2017-12-06 MED ORDER — AZITHROMYCIN 250 MG PO TABS
500.0000 mg | ORAL_TABLET | ORAL | Status: DC
  Filled 2017-12-06: qty 2

## 2017-12-06 MED ORDER — IPRATROPIUM BROMIDE 0.02 % IN SOLN: 0.5000 mg | Freq: Four times a day (QID) | RESPIRATORY_TRACT | Status: DC | PRN

## 2017-12-06 MED ORDER — ENOXAPARIN SODIUM 30 MG/0.3ML ~~LOC~~ SOLN
30.0000 mg | SUBCUTANEOUS | Status: DC
  Administered 2017-12-07: 30 mg via SUBCUTANEOUS
  Filled 2017-12-06 (×2): qty 0.3

## 2017-12-06 MED ORDER — POTASSIUM CHLORIDE 20 MEQ PO PACK
40.0000 meq | PACK | Freq: Once | ORAL | Status: DC
  Filled 2017-12-06: qty 2

## 2017-12-06 MED ORDER — METOPROLOL TARTRATE 5 MG/5ML IV SOLN
5.0000 mg | Freq: Once | INTRAVENOUS | Status: AC
  Administered 2017-12-07: 5 mg via INTRAVENOUS
  Filled 2017-12-06: qty 5

## 2017-12-06 MED ORDER — SODIUM CHLORIDE 0.9 % IV BOLUS
1000.0000 mL | Freq: Once | INTRAVENOUS | Status: AC
  Administered 2017-12-06: 1000 mL via INTRAVENOUS

## 2017-12-06 MED ORDER — MIRTAZAPINE 15 MG PO TABS
15.0000 mg | ORAL_TABLET | Freq: Every day | ORAL | Status: DC
  Filled 2017-12-06: qty 1

## 2017-12-06 MED ORDER — SENNA 8.6 MG PO TABS
1.0000 | ORAL_TABLET | Freq: Two times a day (BID) | ORAL | Status: DC
  Administered 2017-12-06 – 2017-12-10 (×5): 8.6 mg via ORAL
  Filled 2017-12-06 (×6): qty 1

## 2017-12-06 MED ORDER — VANCOMYCIN HCL IN DEXTROSE 1-5 GM/200ML-% IV SOLN
1000.0000 mg | Freq: Once | INTRAVENOUS | Status: AC
  Administered 2017-12-06: 1000 mg via INTRAVENOUS
  Filled 2017-12-06: qty 200

## 2017-12-06 MED ORDER — SODIUM CHLORIDE 0.9 % IV BOLUS (SEPSIS): 500.0000 mL | Freq: Once | INTRAVENOUS | Status: DC

## 2017-12-06 MED ORDER — DOCUSATE SODIUM 100 MG PO CAPS
100.0000 mg | ORAL_CAPSULE | Freq: Two times a day (BID) | ORAL | Status: DC
  Administered 2017-12-08 – 2017-12-10 (×4): 100 mg via ORAL
  Filled 2017-12-06 (×6): qty 1

## 2017-12-06 MED ORDER — ACETAMINOPHEN 325 MG PO TABS: 650.0000 mg | ORAL_TABLET | Freq: Four times a day (QID) | ORAL | Status: DC | PRN

## 2017-12-06 MED ORDER — POTASSIUM CHLORIDE 10 MEQ/100ML IV SOLN
10.0000 meq | INTRAVENOUS | Status: AC
  Administered 2017-12-06 – 2017-12-07 (×5): 10 meq via INTRAVENOUS
  Filled 2017-12-06 (×5): qty 100

## 2017-12-06 MED ORDER — SODIUM CHLORIDE 0.9 % IV SOLN
2.0000 g | Freq: Once | INTRAVENOUS | Status: AC
  Administered 2017-12-06: 2 g via INTRAVENOUS
  Filled 2017-12-06: qty 2

## 2017-12-06 MED ORDER — METOPROLOL TARTRATE 25 MG/10 ML ORAL SUSPENSION
25.0000 mg | Freq: Four times a day (QID) | ORAL | Status: DC
  Filled 2017-12-06 (×2): qty 10

## 2017-12-06 MED ORDER — METOPROLOL TARTRATE 25 MG PO TABS: 25.0000 mg | ORAL_TABLET | Freq: Four times a day (QID) | ORAL | Status: DC

## 2017-12-06 MED ORDER — SODIUM CHLORIDE 0.9 % IV SOLN
500.0000 mg | Freq: Every day | INTRAVENOUS | Status: DC
  Administered 2017-12-07 – 2017-12-08 (×3): 500 mg via INTRAVENOUS
  Filled 2017-12-06 (×3): qty 500

## 2017-12-06 MED ORDER — POTASSIUM CHLORIDE CRYS ER 20 MEQ PO TBCR: 40.0000 meq | EXTENDED_RELEASE_TABLET | Freq: Once | ORAL | Status: DC

## 2017-12-06 MED ORDER — SODIUM CHLORIDE 0.9 % IV SOLN
1.0000 g | INTRAVENOUS | Status: DC
  Administered 2017-12-06 – 2017-12-07 (×2): 1 g via INTRAVENOUS
  Filled 2017-12-06: qty 1
  Filled 2017-12-06: qty 10
  Filled 2017-12-06: qty 1

## 2017-12-06 MED ORDER — ESCITALOPRAM OXALATE 10 MG PO TABS
10.0000 mg | ORAL_TABLET | Freq: Every day | ORAL | Status: DC
  Filled 2017-12-06: qty 1

## 2017-12-06 MED ORDER — VANCOMYCIN HCL IN DEXTROSE 750-5 MG/150ML-% IV SOLN: 750.0000 mg | INTRAVENOUS | Status: DC

## 2017-12-06 MED ORDER — POTASSIUM CHLORIDE 10 MEQ/100ML IV SOLN
10.0000 meq | Freq: Once | INTRAVENOUS | Status: AC
  Administered 2017-12-07: 10 meq via INTRAVENOUS
  Filled 2017-12-06: qty 100

## 2017-12-06 MED ORDER — ACETAMINOPHEN 650 MG RE SUPP: 650.0000 mg | Freq: Four times a day (QID) | RECTAL | Status: DC | PRN

## 2017-12-06 MED ORDER — MAGNESIUM SULFATE 2 GM/50ML IV SOLN
2.0000 g | Freq: Once | INTRAVENOUS | Status: AC
  Administered 2017-12-06: 2 g via INTRAVENOUS
  Filled 2017-12-06: qty 50

## 2017-12-06 NOTE — H&P (Signed)
History and Physical    Brooke Griffin XLK:[id number] DOB: Oct 19, 1925 DOA: 12/06/2017  PCP: [facility] [place] Patient coming from: [facility]  I have personally briefly reviewed patient's old medical records in [place]  Chief Complaint: Mental status change  HPI: Brooke Griffin is a 85 y.o. female with medical history significant for Afib not on A/C due to recent GI bleed, PPM placement (1996), CAD, CVA, HTN, PAD s/p RLE BKA, chronic anemia and peritoneal and omental masses of uncertain etiology who presented to the ED from [place] with nausea, vomiting and cough.  History is obtained from the patient's daughter, [name].  Patient resides at home with her daughter, and goes to [facility] approximately 4 times weekly.  Her daughter reports that her 38-year-old granddaughter recently had an upper respiratory infection that was passed along to herself, and she believes then passed onto her mother.  She reports cough with sputum production that is increased over the last 2 to 3 days.  She also states that her mother has been somewhat more agitated than usual over the weekend.  She has not noted any lethargy or other specific change that was concerning to her.  She is unaware of any fever or new complaints that her mother is having.  She does note that she has noticed her mother coughing after eating or drinking and occasionally having episodes of nonbloody, nonbilious posttussive emesis.  She has not noticed any urinary changes, no blood in the stool or urine, no diarrhea.  ED Course: In the ED, patient afebrile, tachycardic, nontachypneic, normotensive, saturating comfortably in the upper 90s on 2 L via nasal cannula.  Labs notable for WBC 19.5, Hgb 8.4, platelets 472, sodium 153, potassium 2.5, chloride 115, CO2 29, BUN 19, creatinine 0.95, calcium 7.9 (corrected 9.6), anion gap 9, albumin 1.9, total bilirubin 1.4, ammonia 26, lactate 2.3.  VBG showed pH  7.34 and PCO2 of 54.  Chest x-ray shows interval increase in right base opacity with stable cardiomegaly.  CT head showed no acute findings and a background of chronic microvascular disease.  Review of Systems: As per HPI otherwise 10 point review of systems negative.   Past Medical History:  Diagnosis Date  . Amiodarone    Amiodarone was started during hospitalization September, 2012  . Bradycardia   . Chest pain    Nuclear, hospital, September, 2012, fixed inferolateral defect and compatible with remote infarct, EF 66%, no ischemia  . Cholecystitis    Laparoscopic cholecystectomy September 20 01  . Dementia ([place])   . Diverticulosis   . Ejection fraction    EF 55-60%, echo, November 18, 2010, hypokinesis of the basal inferior wall  . GI bleed   . Hematoma    Right flank hematoma / cellulitis  September, 2012  . History of blood transfusion 09/01/2017  . Hypertension   . Peripheral arterial disease ([place])   . Permanent atrial fibrillation    previously anticoagulated with plavix by Dr Ron Parker  . Pleural effusion    Right pleural effusion, thoracentesis, October, 2012, for therapeutic reasons  . Stroke ([place])   . Symptomatic anemia 09/01/2017    Past Surgical History:  Procedure Laterality Date  . CHOLECYSTECTOMY  11/21/10  . hysterectomy - unknown type    . PACEMAKER GENERATOR CHANGE Right 11/26/2015   [employer] Adapta gen change by Dr Purvis Kilts  . PACEMAKER INSERTION       reports that she has quit smoking. She has  never used smokeless tobacco. She reports that she drinks alcohol. She reports that she does not use drugs.  Allergies  Allergen Reactions  . Donepezil Nausea And Vomiting    Family History  Problem Relation Age of Onset  . Hypertension Mother   . Heart disease Mother        heart attack  . Heart disease Unknown   . Hypertension Unknown     Prior to Admission medications   Medication Sig Start Date End Date Taking? Authorizing Provider  digoxin (LANOXIN)  0.125 MG tablet Take 1 tablet (0.125 mg total) by mouth every other day. 07/02/16  Yes Allred, Jeneen Rinks, MD  ergocalciferol (VITAMIN D2) 50000 units capsule Take 50,000 Units by mouth once a week.   Yes [provider]  escitalopram (LEXAPRO) 10 MG tablet Take 10 mg by mouth at bedtime.   Yes [provider]  metoprolol (LOPRESSOR) 50 MG tablet Take 1 tablet (50 mg total) by mouth 2 (two) times daily. 12/05/14  Yes Carlena Bjornstad, MD  mirtazapine (REMERON) 15 MG tablet Take 15 mg by mouth at bedtime.   Yes [provider]  Multiple Vitamins-Minerals (ICAPS AREDS FORMULA PO) Take 1 capsule by mouth 2 (two) times daily.    Yes [provider]  OVER THE COUNTER MEDICATION Take 5 mLs by mouth daily as needed (cough). Equate Daytime Homeopathic Cough and Cold Medicine   Yes [provider]  zinc oxide (BALMEX) 11.3 % CREA cream Apply 1 application topically daily as needed.    Yes [provider]  acetaminophen (TYLENOL) 500 MG tablet Take 1,000 mg by mouth every 6 (six) hours as needed for mild pain or moderate pain.    [provider]  fluticasone (FLONASE) 50 MCG/ACT nasal spray Place 2 sprays into both nostrils daily.    [provider]  guaifenesin (ROBITUSSIN) 100 MG/5ML syrup Take 200 mg by mouth 3 (three) times daily as needed for cough.    [provider]  sertraline (ZOLOFT) 50 MG tablet Take 75 mg by mouth daily.    [provider]    Physical Exam: Vitals:   12/06/17 1606 12/06/17 1730 12/06/17 1822 12/06/17 1858  BP: 106/65 121/69 132/78 132/78  Pulse: (!) 110  (!) 58 (!) 136  Resp: 15  15 (!) 24  Temp:      TempSrc:      SpO2: 96%  98% 98%    Constitutional: NAD, calm, comfortable Eyes: PERRL, lids and conjunctivae pale ENMT: Mucous membranes are moist. Posterior pharynx clear of any exudate or lesions. Neck: normal, supple, no masses Respiratory: decreased at the right base, no W/C/R. Normal  respiratory effort. No accessory muscle use.  Cardiovascular: irregularly irregular, tachycardic, no murmurs / rubs / gallops. No extremity edema. 1+ pedal pulses. Abdomen: no tenderness, no masses palpated. Bowel sounds positive.  Musculoskeletal: s/p right BKA, no clubbing / cyanosis. Normal muscle tone.  Skin: no rashes, lesions, ulcers. No induration Neurologic: CN 2-12 grossly intact. Sensation intact. Strength grossly normal in BUE and LLE.  Psychiatric: non-verbal,unable to assess    Labs on Admission: I have personally reviewed following labs and imaging studies  CBC: Recent Labs  Lab 12/06/17 1504  WBC 19.5*  NEUTROABS 18.1*  HGB 8.4*  HCT 31.9*  MCV 83.5  PLT 973*   Basic Metabolic Panel: Recent Labs  Lab 12/06/17 1504  NA 153*  K 2.5*  CL 115*  CO2 29  GLUCOSE 118*  BUN 19  CREATININE  0.95  CALCIUM 7.9*   GFR: CrCl cannot be calculated (Unknown ideal weight.). Liver Function Tests: Recent Labs  Lab 12/06/17 1504  AST 19  ALT 10  ALKPHOS 63  BILITOT 1.4*  PROT 5.8*  ALBUMIN 1.9*   No results for input(s): LIPASE, AMYLASE in the last 168 hours. Recent Labs  Lab 12/06/17 1700  AMMONIA 26   Coagulation Profile: No results for input(s): INR, PROTIME in the last 168 hours. Cardiac Enzymes: No results for input(s): CKTOTAL, CKMB, CKMBINDEX, TROPONINI in the last 168 hours. BNP (last 3 results) No results for input(s): PROBNP in the last 8760 hours. HbA1C: No results for input(s): HGBA1C in the last 72 hours. CBG: Recent Labs  Lab 12/06/17 1405  GLUCAP 139*   Lipid Profile: No results for input(s): CHOL, HDL, LDLCALC, TRIG, CHOLHDL, LDLDIRECT in the last 72 hours. Thyroid Function Tests: No results for input(s): TSH, T4TOTAL, FREET4, T3FREE, THYROIDAB in the last 72 hours. Anemia Panel: No results for input(s): VITAMINB12, FOLATE, FERRITIN, TIBC, IRON, RETICCTPCT in the last 72 hours.  Radiological Exams on Admission: Ct Head Wo  Contrast  Result Date: 12/06/2017 CLINICAL DATA:  84 year old female with a history of vomiting EXAM: CT HEAD WITHOUT CONTRAST TECHNIQUE: Contiguous axial images were obtained from the base of the skull through the vertex without intravenous contrast. COMPARISON:  06/01/2013 FINDINGS: Brain: No acute intracranial hemorrhage. No midline shift or mass effect. Gray-white differentiation relatively maintained. Confluent hypodensity in the periventricular white matter, similar to the prior though appears to have progressed. Diffuse volume loss with expansion of the ventricles. Vascular: Calcifications of the intracranial vasculature. Skull: No acute displaced fracture.  No focal soft tissue swelling Sinuses/Orbits: No acute finding. Other: None IMPRESSION: Negative for acute intracranial finding. Evidence of chronic microvascular ischemic disease, progressed from the prior CT, with senescent brain volume loss. Electronically Signed   By: Corrie Mckusick D.O.   On: 12/06/2017 16:30   Dg Chest Port 1 View  Result Date: 12/06/2017 CLINICAL DATA:  Shortness of breath, altered mental status. EXAM: PORTABLE CHEST 1 VIEW COMPARISON:  Radiographs of October 27, 2017. FINDINGS: Stable cardiomegaly. Atherosclerosis of thoracic aorta is noted. Single lead right-sided pacemaker is unchanged in position. No pneumothorax is noted. Increased right basilar opacity is noted concerning for edema or infiltrate with associated pleural effusion. Mild left basilar subsegmental atelectasis or edema is noted. Bony thorax is unremarkable. IMPRESSION: Increased right basilar opacity is noted concerning for worsening edema or pneumonia with associated pleural effusion. Aortic Atherosclerosis (ICD10-I70.0). Electronically Signed   By: Marijo Conception, M.D.   On: 12/06/2017 15:24    EKG: Independently reviewed. Afib, rate 135. Low voltage tracing. Incomplete LBBB.  Assessment/Plan Active Problems:   Community acquired pneumonia  Ms.  Casad is a 84 y.o. female with above medical history presenting with cough with sputum production and RLL opacity concerning for CAP. Based on current CAP guidelines, B-lactam plus macrolide is the most appropriate initial therapy for CAP. She does not meet high risk criteria for MDR organisms. No prior documented respiratory or nasal cultures with MRSA or Pseudomonas. CURB-65 score is 2. Electrolyte abnormalities and hypoalbuminemia suggest probably chronic poor PO intake. Afib is present on admission as well.  Severe sepsis 2/2 CAP - Ceftriaxone, azithromycin - Obtain blood, urine, sputum cultures, RVP - Can consider further imaging of the lung given the persistent RLL opacity on CXR - SLP eval for likely dysphagia, aspiration risk - Trend lactate to close - Hold on further volume  resuscitation following 2.5L administered in ED - Keep NPO while awaiting swallow eval - Maintain SpO2 >90% - Atrovent nebs PRN - RT eval and treat - Trend fever curve - Daily CBC  Hypokalemia, hypernatremia - Aggressive potassium repletion - Check Mg, replete to goal >2 - Repeat BMP post hydration to assess sodium level - Would avoid NS and utilize iso-osmolar, hypotonic solution if further need for sodium correction  Chronic Afib - Switch home Lopressor to 25 mg Q6H for rate control - Continue home digoxin - Pt off A/C given recent suspected UGIB - Monitor on telemetry - Goal K>4, Mg>2  Chronic anemia - Hgb 8.4 on arrival to ED, expect dilutional change following volume resuscitation - Goal Hgb >7 - Will start VTE ppx, continue to hold Eliquis  Chronic medical conditions - Depression: continue Lexapro, Remeron - Dementia: delirium precautions, avoid sedating meds, anticholinergics as able  DVT prophylaxis: Lovenox Code Status: DNR Family Communication: Daughter, Lucretia Disposition Plan: Home v SNF in 2-3 days Admission status: Inpatient, telemetry   Lezlee Gills Sharene Butters MD [facility]  If 7PM-7AM, please contact night-coverage [email or web address] Password [email or web address]  12/06/2017, 7:26 PM

## 2017-12-06 NOTE — ED Notes (Signed)
Date and time results received: 12/06/17 1706  Test: Potassium  Critical Value: 2.5   Name of Provider Notified: Dr. Jerilynn Mages. Pfeiffer   Orders Received? Or Actions Taken?: Will continue to monitor and await for new orders.

## 2017-12-06 NOTE — Progress Notes (Addendum)
CRITICAL VALUE ALERT  Critical Value: lactic acid- 2.0  Date & Time Notied:  12-06-17  Provider Notified: yes  Orders Received/Actions taken: pending

## 2017-12-06 NOTE — Progress Notes (Addendum)
Pharmacy Antibiotic Note  Brooke Griffin is a 84 y.o. female admitted on 12/06/2017 with vomiting for the past 4 days.  Pharmacy has been consulted for dosing vancomycin and cefepime for pneumonia.  Plan: Vancomycin 1gm IV x 1 then 750mg  q36h (AUC 548.9, Scr 0.95)  Cefepime 2gm IV x 1 then 1gm q24h Follow renal function, cultures and clinical course    Temp (24hrs), Avg:97.9 F (36.6 C), Min:97.6 F (36.4 C), Max:98.1 F (36.7 C)  Recent Labs  Lab 12/06/17 1504 12/06/17 1603  WBC 19.5*  --   LATICACIDVEN  --  2.28*    CrCl cannot be calculated (Patient's most recent lab result is older than the maximum 21 days allowed.).    Allergies  Allergen Reactions  . Donepezil Nausea And Vomiting    Antimicrobials this admission: 10/14 vanc >> 10/14 cefepime >> Dose adjustments this admission:   Microbiology results: 10/14 BCx:  10/14 UCx:     Thank you for allowing pharmacy to be a part of this patient's care.  Dolly Rias RPh 12/06/2017, 5:06 PM Pager [phone]

## 2017-12-06 NOTE — ED Provider Notes (Addendum)
Patient accepted at signout from Dr. Eulis Foster.  [facility]  She has had decrease in mental status from baseline.  Also she was noted to have vomiting.  Patient's daughter adds that she had pneumonia several weeks ago but was treated outpatient.  Dr. Eulis Foster initiated evaluation. Physical Exam  BP 132/78   Pulse (!) 58   Temp 98.1 F (36.7 C) (Rectal)   Resp 15   SpO2 98%   Physical Exam Patient is resting.  She is comfortable in appearance without respiratory distress.  She remains tachycardic with normal blood pressures. 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  She has permanent atrial fibrillation.  She has a left brain CVA, dominant hemisphere.  She required transfusions this year for anemia.  This could not be fully evaluated because of the degree of her dementia.  She was able to speak to the provider on this visit.   [EW]  1559 I received a call from a nurse who saw the patient today at [facility].  She noticed that the patient was last conversant today than usual, and "weaker."  She also felt that the patient had vomited because she saw emesis on her shirt.  Because of this and family members report of vomiting at home over the weekend, the patient was sent here.  Apparently the family is on the way to the emergency department, to see the patient.   [EW]    Clinical Course User Index [EW] Daleen Bo, MD   EKG: Atrial fibrillation 135 QRS 102 no acute ischemic changes.  Slight increased intraventricular conduction delay compared to previous EKG.  Procedures CRITICAL CARE Performed by: Charlesetta Shanks   Total critical care time: 20 minutes  Critical care time was exclusive of separately billable procedures and treating other patients.  Critical care was necessary to treat  or prevent imminent or life-threatening deterioration.  Critical care was time spent personally by me on the following activities: development of treatment plan with patient and/or surrogate as well as nursing, discussions with consultants, evaluation of patient's response to treatment, examination of patient, obtaining history from patient or surrogate, ordering and performing treatments and interventions, ordering and review of laboratory studies, ordering and review of radiographic studies, pulse oximetry and re-evaluation of patient's condition. MDM  Patient has leukocytosis, positive infiltrate on chest x-ray suggestive of pneumonia versus edema per radiology, tachycardia with mental status change from baseline.  At this time, I suspect this is healthcare associated pneumonia.  Patient had been treated outpatient and now presents with mental status change, leukocytosis and positive chest x-ray.  I have reviewed plan and findings with the patient's daughter who is now at bedside.  Plan for admission.      Charlesetta Shanks, MD 12/06/17 Alonza Bogus, MD 12/06/17 [phone]

## 2017-12-06 NOTE — ED Notes (Signed)
ED TO INPATIENT HANDOFF REPORT  Name/Age/Gender Brooke Griffin 84 y.o. female  Code Status Code Status History    Date Active Date Inactive Code Status Order ID Comments User Context   09/01/2017 1710 09/03/2017 1440 DNR [id number]  Ledell Noss, MD ED    Questions for Most Recent Historical Code Status (Order [id number])    Question Answer Comment   In the event of cardiac or respiratory ARREST Do not call a "code blue"    In the event of cardiac or respiratory ARREST Do not perform Intubation, CPR, defibrillation or ACLS    In the event of cardiac or respiratory ARREST Use medication by any route, position, wound care, and other measures to relive pain and suffering. May use oxygen, suction and manual treatment of airway obstruction as needed for comfort.       Home/SNF/Other Home  Chief Complaint emesis   Level of Care/Admitting Diagnosis ED Disposition    ED Disposition Condition Comment   Admit  Hospital Area: [facility] [[id number]]  Level of Care: Med-Surg [16]  Diagnosis: Community acquired pneumonia [[id number]]  Admitting Physician: Bennie Pierini [[id number]]  Attending Physician: Jonnie Finner, [place] [[id number]]  Estimated length of stay: past midnight tomorrow  Certification:: I certify this patient will need inpatient services for at least 2 midnights  PT Class (Do Not Modify): Inpatient [101]  PT Acc Code (Do Not Modify): Private [1]       Medical History Past Medical History:  Diagnosis Date  . Amiodarone    Amiodarone was started during hospitalization September, 2012  . Bradycardia   . Chest pain    Nuclear, hospital, September, 2012, fixed inferolateral defect and compatible with remote infarct, EF 66%, no ischemia  . Cholecystitis    Laparoscopic cholecystectomy September 20 01  . Dementia ([place])   . Diverticulosis   . Ejection fraction    EF 55-60%, echo, November 18, 2010, hypokinesis of the basal inferior wall  . GI bleed   . Hematoma     Right flank hematoma / cellulitis  September, 2012  . History of blood transfusion 09/01/2017  . Hypertension   . Peripheral arterial disease ([place])   . Permanent atrial fibrillation    previously anticoagulated with plavix by Dr Ron Parker  . Pleural effusion    Right pleural effusion, thoracentesis, October, 2012, for therapeutic reasons  . Stroke ([place])   . Symptomatic anemia 09/01/2017    Allergies Allergies  Allergen Reactions  . Donepezil Nausea And Vomiting    IV Location/Drains/Wounds Patient Lines/Drains/Airways Status   Active Line/Drains/Airways    Name:   Placement date:   Placement time:   Site:   Days:   Peripheral IV 09/02/17 Left Antecubital   09/02/17    0110    Antecubital   95   Peripheral IV 09/02/17 Left Hand   09/02/17    1952    Hand   95   Peripheral IV 09/03/17 Left;Posterior;Lateral Forearm   09/03/17    0523    Forearm   94   Peripheral IV 12/06/17 Left Forearm   12/06/17    1559    Forearm   less than 1   Peripheral IV 12/06/17 Left Hand   12/06/17    1600    Hand   less than 1   External Urinary Catheter   12/06/17    1420    -   less than 1          Labs/Imaging Results  for orders placed or performed during the hospital encounter of 12/06/17 (from the past 48 hour(s))  CBG monitoring, ED     Status: Abnormal   Collection Time: 12/06/17  2:05 PM  Result Value Ref Range   Glucose-Capillary 139 (H) 70 - 99 mg/dL  Comprehensive metabolic panel     Status: Abnormal   Collection Time: 12/06/17  3:04 PM  Result Value Ref Range   Sodium 153 (H) 135 - 145 mmol/L   Potassium 2.5 (LL) 3.5 - 5.1 mmol/L    Comment: CRITICAL RESULT CALLED TO, READ BACK BY AND VERIFIED WITH: J.INMAN AT 1706 ON 12/06/17 BY N.THOMPSON    Chloride 115 (H) 98 - 111 mmol/L   CO2 29 22 - 32 mmol/L   Glucose, Bld 118 (H) 70 - 99 mg/dL   BUN 19 8 - 23 mg/dL   Creatinine, Ser 0.95 0.44 - 1.00 mg/dL   Calcium 7.9 (L) 8.9 - 10.3 mg/dL   Total Protein 5.8 (L) 6.5 - 8.1 g/dL    Albumin 1.9 (L) 3.5 - 5.0 g/dL   AST 19 15 - 41 U/L   ALT 10 0 - 44 U/L   Alkaline Phosphatase 63 38 - 126 U/L   Total Bilirubin 1.4 (H) 0.3 - 1.2 mg/dL   GFR calc non Af Amer 52 (L) >60 mL/min   GFR calc Af Amer 60 (L) >60 mL/min    Comment: (NOTE) The eGFR has been calculated using the CKD EPI equation. This calculation has not been validated in all clinical situations. eGFR's persistently <60 mL/min signify possible Chronic Kidney Disease.    Anion gap 9 5 - 15    Comment: Performed at [facility], [place] [address], [place], [place] [address]  CBC with Differential     Status: Abnormal   Collection Time: 12/06/17  3:04 PM  Result Value Ref Range   WBC 19.5 (H) 4.0 - 10.5 K/uL   RBC 3.82 (L) 3.87 - 5.11 MIL/uL   Hemoglobin 8.4 (L) 12.0 - 15.0 g/dL   HCT 31.9 (L) 36.0 - 46.0 %   MCV 83.5 80.0 - 100.0 fL   MCH 22.0 (L) 26.0 - 34.0 pg   MCHC 26.3 (L) 30.0 - 36.0 g/dL   RDW 18.0 (H) 11.5 - 15.5 %   Platelets 472 (H) 150 - 400 K/uL   nRBC 0.3 (H) 0.0 - 0.2 %   Neutrophils Relative % 93 %   Neutro Abs 18.1 (H) 1.7 - 7.7 K/uL   Lymphocytes Relative 3 %   Lymphs Abs 0.6 (L) 0.7 - 4.0 K/uL   Monocytes Relative 3 %   Monocytes Absolute 0.6 0.1 - 1.0 K/uL   Eosinophils Relative 0 %   Eosinophils Absolute 0.0 0.0 - 0.5 K/uL   Basophils Relative 0 %   Basophils Absolute 0.0 0.0 - 0.1 K/uL   Immature Granulocytes 1 %   Abs Immature Granulocytes 0.17 (H) 0.00 - 0.07 K/uL    Comment: Performed at [facility], [address], [place], [place] [address]  I-Stat CG4 Lactic Acid, ED     Status: Abnormal   Collection Time: 12/06/17  4:03 PM  Result Value Ref Range   Lactic Acid, Venous 2.28 (HH) 0.5 - 1.9 mmol/L   Comment NOTIFIED PHYSICIAN   Blood gas, venous     Status: Abnormal   Collection Time: 12/06/17  4:05 PM  Result Value Ref Range   pH, Ven 7.344 7.250 - 7.430   pCO2,  Ven 54.5 44.0 - 60.0 mmHg   pO2, Ven  32.0 - 45.0 mmHg    CRITICAL  RESULT CALLED TO, READ BACK BY AND VERIFIED WITH:    Comment: BELOW REPORTABLE RANGE CRITICAL RESULT CALLED TO, READ BACK BY AND VERIFIED WITH: DR Eulis Foster BY RPOWELL RRT ON 12/06/17/ 1606    Bicarbonate 28.9 (H) 20.0 - 28.0 mmol/L   Acid-Base Excess 3.1 (H) 0.0 - 2.0 mmol/L   O2 Saturation 28.2 %   Patient temperature 98.6    Collection site DRAWN BY RN    Drawn by [phone]    Sample type VENOUS     Comment: Performed at [facility], [place] [address], [place], [place] [address]  Ammonia     Status: None   Collection Time: 12/06/17  5:00 PM  Result Value Ref Range   Ammonia 26 9 - 35 umol/L    Comment: Performed at [facility], [place] [address], [place],  [address]   Ct Head Wo Contrast  Result Date: 12/06/2017 CLINICAL DATA:  84 year old female with a history of vomiting EXAM: CT HEAD WITHOUT CONTRAST TECHNIQUE: Contiguous axial images were obtained from the base of the skull through the vertex without intravenous contrast. COMPARISON:  06/01/2013 FINDINGS: Brain: No acute intracranial hemorrhage. No midline shift or mass effect. Gray-white differentiation relatively maintained. Confluent hypodensity in the periventricular white matter, similar to the prior though appears to have progressed. Diffuse volume loss with expansion of the ventricles. Vascular: Calcifications of the intracranial vasculature. Skull: No acute displaced fracture.  No focal soft tissue swelling Sinuses/Orbits: No acute finding. Other: None IMPRESSION: Negative for acute intracranial finding. Evidence of chronic microvascular ischemic disease, progressed from the prior CT, with senescent brain volume loss. Electronically Signed   By: Corrie Mckusick D.O.   On: 12/06/2017 16:30   Dg Chest Port 1 View  Result Date: 12/06/2017 CLINICAL DATA:  Shortness of breath, altered mental status. EXAM: PORTABLE CHEST 1 VIEW COMPARISON:  Radiographs of October 27, 2017. FINDINGS: Stable  cardiomegaly. Atherosclerosis of thoracic aorta is noted. Single lead right-sided pacemaker is unchanged in position. No pneumothorax is noted. Increased right basilar opacity is noted concerning for edema or infiltrate with associated pleural effusion. Mild left basilar subsegmental atelectasis or edema is noted. Bony thorax is unremarkable. IMPRESSION: Increased right basilar opacity is noted concerning for worsening edema or pneumonia with associated pleural effusion. Aortic Atherosclerosis (ICD10-I70.0). Electronically Signed   By: Marijo Conception, M.D.   On: 12/06/2017 15:24   EKG Interpretation  Date/Time:  Monday December 06 2017 18:43:25 EDT Ventricular Rate:  135 PR Interval:    QRS Duration: 102 QT Interval:  308 QTC Calculation: 462 R Axis:   -31 Text Interpretation:  Atrial fibrillation Anterior infarct, old Repolarization abnormality, prob rate related similar to old except slight increase in IVCD Confirmed by Charlesetta Shanks [phone]) on 12/06/2017 6:49:17 PM   Pending Labs Unresulted Labs (From admission, onward)    Start     Ordered   12/06/17 8466  Basic metabolic panel  Once,   R     12/06/17 1841   12/06/17 1839  Influenza panel by PCR (type A & B)  (Influenza PCR Panel)  Once,   R     12/06/17 1838   12/06/17 1702  Urine culture  STAT,   STAT     12/06/17 1701   12/06/17 1505  Culture, blood (routine x 2)  BLOOD CULTURE X 2,   STAT  12/06/17 1504   12/06/17 1504  Urinalysis, Routine w reflex microscopic  STAT,   STAT     12/06/17 1504   Signed and Held  Culture, sputum-assessment  Once,   R    Question:  Patient immune status  Answer:  Normal   Signed and Held   Signed and Held  Gram stain  Once,   R    Question:  Patient immune status  Answer:  Normal   Signed and Held   Signed and Held  Strep pneumoniae urinary antigen  Once,   R     Signed and Held   Signed and Held  Magnesium  Add-on,   R     Signed and Held   Signed and Held  Phosphorus  Add-on,   R      Signed and Held   Signed and Held  TSH  Add-on,   R     Signed and Held   Signed and Occupational hygienist morning,   R     Signed and Held   Signed and Held  CBC  Tomorrow morning,   R     Signed and Held   Signed and Held  Legionella Pneumophila Serogp 1 Ur Ag  Once,   R     Signed and Held   Signed and Held  Respiratory Panel by PCR  (Respiratory virus panel)  Once,   R     Signed and Held          Vitals/Pain Today's Vitals   12/06/17 1534 12/06/17 1606 12/06/17 1730 12/06/17 1822  BP:  106/65 121/69 132/78  Pulse:  (!) 110  (!) 58  Resp:  15  15  Temp: 98.1 F (36.7 C)     TempSrc: Rectal     SpO2:  96%  98%    Isolation Precautions Droplet precaution  Medications Medications  sodium chloride 0.9 % bolus 1,000 mL (1,000 mLs Intravenous New Bag/Given 12/06/17 1740)    And  sodium chloride 0.9 % bolus 500 mL (has no administration in time range)  ceFEPIme (MAXIPIME) 1 g in sodium chloride 0.9 % 100 mL IVPB (has no administration in time range)  vancomycin (VANCOCIN) IVPB 750 mg/150 ml premix (has no administration in time range)  potassium chloride 10 mEq in 100 mL IVPB (has no administration in time range)  potassium chloride SA (K-DUR,KLOR-CON) CR tablet 40 mEq (has no administration in time range)  sodium chloride 0.9 % bolus 1,000 mL (1,000 mLs Intravenous New Bag/Given 12/06/17 1515)  vancomycin (VANCOCIN) IVPB 1000 mg/200 mL premix (1,000 mg Intravenous New Bag/Given 12/06/17 1738)  ceFEPIme (MAXIPIME) 2 g in sodium chloride 0.9 % 100 mL IVPB (2 g Intravenous New Bag/Given 12/06/17 1731)    Mobility walks

## 2017-12-06 NOTE — Progress Notes (Signed)
CRITICAL VALUE ALERT  Critical Value:  Potassium- 2.5  Date & Time Notied:  12-06-17  Provider Notified: yes  Orders Received/Actions taken: pending

## 2017-12-06 NOTE — ED Notes (Signed)
Lactic given to RN and MD.   

## 2017-12-06 NOTE — ED Triage Notes (Signed)
Pt arrived via EMS from [place]. Pt has had vomiting x 4 days. Pt is non verbal /non ambulating rt AKA. Pt baseline. Pt has PM.  Pt was placed on O2 as per EMS saturation was 82% on RA, Pt placed on 10 lpm via NR.   EMS reports that not a lot of information was provided     EMS v/s BP 109/49, HR 68, RR 100 NR/ 10 lpm

## 2017-12-06 NOTE — ED Notes (Signed)
Ginette Pitman, RN at [facility] called to check on pt.  Would like to be updated once the pt has a disposition.

## 2017-12-06 NOTE — Progress Notes (Signed)
Pt's daughter this evening upon admission had to leave immediately after answering some of the admission questions. She did not tell RN how pt takes meds at home. Assumed (based on pureed diet at home) she would need them crushed in applesauce or pudding. Pt became agitated and started swatting at RN to get meds away from her. Did not want plain applesauce or sip of water either. Documented meds were not given and notified on-call MD.

## 2017-12-06 NOTE — ED Notes (Signed)
Bed: WA17 Expected date:  Expected time:  Means of arrival:  Comments: EMS-N/V-room 16 or 17

## 2017-12-06 NOTE — ED Provider Notes (Signed)
[facility] DEPT Provider Note   CSN: [id number] Arrival date & time: 12/06/17  1353     History   Chief Complaint Chief Complaint  Patient presents with  . Emesis    HPI Brooke Griffin is a 84 y.o. female.  HPI   She is here for evaluation of vomiting which is reportedly been present for 4 days.  [facility] her today.  Limited information is available regarding her end-of-life care and CODE STATUS.  She is unable to give any history.  Level 5 caveat-mental status  Past Medical History:  Diagnosis Date  . Amiodarone    Amiodarone was started during hospitalization September, 2012  . Bradycardia   . Chest pain    Nuclear, hospital, September, 2012, fixed inferolateral defect and compatible with remote infarct, EF 66%, no ischemia  . Cholecystitis    Laparoscopic cholecystectomy September 20 01  . Dementia ([place])   . Diverticulosis   . Ejection fraction    EF 55-60%, echo, November 18, 2010, hypokinesis of the basal inferior wall  . GI bleed   . Hematoma    Right flank hematoma / cellulitis  September, 2012  . History of blood transfusion 09/01/2017  . Hypertension   . Peripheral arterial disease ([place])   . Permanent atrial fibrillation    previously anticoagulated with plavix by Dr Ron Parker  . Pleural effusion    Right pleural effusion, thoracentesis, October, 2012, for therapeutic reasons  . Stroke ([place])   . Symptomatic anemia 09/01/2017    Patient Active Problem List   Diagnosis Date Noted  . Symptomatic anemia 09/01/2017  . Above knee amputation of right lower extremity ([place]) 01/22/2016  . Bradycardia 12/13/2015  . Atherosclerosis of native artery of right leg with gangrene ([place]) 09/18/2015  . Hypernatremia 09/18/2015  . Hypokalemia 09/18/2015  . Leukocytosis 09/18/2015  . Atherosclerosis of native artery of right lower extremity with gangrene ([place]) 09/17/2015  . PVD  (peripheral vascular disease) ([place]) 09/17/2015  . Age-related osteoporosis with current pathological fracture with routine healing 08/14/2015  . Closed compression fracture of second lumbar vertebra ([place]) 08/14/2015  . Benign essential hypertension 03/18/2015  . Depression 03/18/2015  . Diverticulosis 03/18/2015  . Hyperglycemia 03/18/2015  . Insomnia 03/18/2015  . Polycythemia 03/18/2015  . Lumbar compression fracture ([place]) 11/04/2013  . Thoracic compression fracture ([place]) 11/04/2013  . Tachycardia-bradycardia ([place])   . Cholecystitis chronic, acute 01/05/2011  . Cardiac pacemaker in situ   . Advanced dementia ([place])   . Hematoma   . Essential hypertension   . Rapid atrial fibrillation ([place])   . Pleural effusion   . Ejection fraction   . CVA (cerebral infarction)   . Amiodarone   . Precordial chest pain     Past Surgical History:  Procedure Laterality Date  . CHOLECYSTECTOMY  11/21/10  . hysterectomy - unknown type    . PACEMAKER GENERATOR CHANGE Right 11/26/2015   Medtronic Adapta gen change by Dr Purvis Kilts  . PACEMAKER INSERTION       OB History   None      Home Medications    Prior to Admission medications   Medication Sig Start Date End Date Taking? Authorizing Provider  acetaminophen (TYLENOL) 500 MG tablet Take 1,000 mg by mouth every 6 (six) hours as needed for mild pain or moderate pain.   Yes [provider]  digoxin (LANOXIN) 0.125 MG tablet Take 1 tablet (0.125 mg total) by mouth every  other day. 07/02/16  Yes Allred, Jeneen Rinks, MD  ergocalciferol (VITAMIN D2) 50000 units capsule Take 50,000 Units by mouth once a week.   Yes [provider]  escitalopram (LEXAPRO) 10 MG tablet Take 10 mg by mouth at bedtime.   Yes [provider]  guaifenesin (ROBITUSSIN) 100 MG/5ML syrup Take 200 mg by mouth 3 (three) times daily as needed for cough.   Yes [provider]  metoprolol (LOPRESSOR) 50 MG tablet Take 1 tablet (50 mg total) by mouth 2  (two) times daily. 12/05/14  Yes Carlena Bjornstad, MD  mirtazapine (REMERON) 15 MG tablet Take 15 mg by mouth at bedtime.   Yes [provider]  Multiple Vitamins-Minerals (ICAPS AREDS FORMULA PO) Take 1 capsule by mouth 2 (two) times daily.    Yes [provider]  sertraline (ZOLOFT) 50 MG tablet Take 75 mg by mouth daily.   Yes [provider]  zinc oxide (BALMEX) 11.3 % CREA cream Apply 1 application topically daily as needed.    Yes [provider]  fluticasone (FLONASE) 50 MCG/ACT nasal spray Place 2 sprays into both nostrils daily.    [provider]    Family History Family History  Problem Relation Age of Onset  . Hypertension Mother   . Heart disease Mother        heart attack  . Heart disease Unknown   . Hypertension Unknown     Social History Social History   Tobacco Use  . Smoking status: Former Research scientist (life sciences)  . Smokeless tobacco: Never Used  Substance Use Topics  . Alcohol use: Yes    Alcohol/week: 0.0 standard drinks    Comment: occasional  . Drug use: No     Allergies   Donepezil   Review of Systems Review of Systems  Unable to perform ROS: Mental status change     Physical Exam Updated Vital Signs BP 118/76   Temp 98.1 F (36.7 C) (Rectal)   Resp 16   SpO2 93%   Physical Exam  Constitutional: She appears well-developed. She appears distressed (Groans occasionally.).  Elderly, frail  HENT:  Head: Normocephalic and atraumatic.  Right Ear: External ear normal.  Left Ear: External ear normal.  Eyes: Pupils are equal, round, and reactive to light. Conjunctivae and EOM are normal.  Neck: Normal range of motion and phonation normal. Neck supple.  Cardiovascular: Normal rate, regular rhythm and normal heart sounds.  Pulmonary/Chest: Effort normal and breath sounds normal. No stridor. No respiratory distress. She exhibits no bony tenderness.  Abdominal: Soft. There is no tenderness.  Musculoskeletal: She  exhibits deformity (Right AKA). She exhibits no edema.  Neurological:  Somewhat diminished tone right upper extremity.  No noted facial asymmetry.  She does not follow command to raise legs off stretcher.  Skin: Skin is warm, dry and intact.  Psychiatric:  Lethargic  Nursing note and vitals reviewed.    ED Treatments / Results  Labs (all labs ordered are listed, but only abnormal results are displayed) Labs Reviewed  CBG MONITORING, ED - Abnormal; Notable for the following components:      Result Value   Glucose-Capillary 139 (*)    All other components within normal limits  CULTURE, BLOOD (ROUTINE X 2)  CULTURE, BLOOD (ROUTINE X 2)  URINALYSIS, ROUTINE W REFLEX MICROSCOPIC  COMPREHENSIVE METABOLIC PANEL  CBC WITH DIFFERENTIAL/PLATELET  BLOOD GAS, VENOUS  AMMONIA  I-STAT CG4 LACTIC ACID, ED    EKG None  Radiology Dg Chest [facility] [address]  Result Date: 12/06/2017 CLINICAL DATA:  Shortness of breath, altered mental status. EXAM: PORTABLE CHEST 1 VIEW COMPARISON:  Radiographs of October 27, 2017. FINDINGS: Stable cardiomegaly. Atherosclerosis of thoracic aorta is noted. Single lead right-sided pacemaker is unchanged in position. No pneumothorax is noted. Increased right basilar opacity is noted concerning for edema or infiltrate with associated pleural effusion. Mild left basilar subsegmental atelectasis or edema is noted. Bony thorax is unremarkable. IMPRESSION: Increased right basilar opacity is noted concerning for worsening edema or pneumonia with associated pleural effusion. Aortic Atherosclerosis (ICD10-I70.0). Electronically Signed   By: Marijo Conception, M.D.   On: 12/06/2017 15:24    Procedures Procedures (including critical care time)  Medications Ordered in ED Medications  sodium chloride 0.9 % bolus 1,000 mL (has no administration in time range)     Initial Impression / Assessment and Plan / ED Course  I have reviewed the triage vital signs and the nursing  notes.  Pertinent labs & imaging results that were available during my care of the patient were reviewed by me and considered in my medical decision making (see chart for details).  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  She has permanent atrial fibrillation.  She has a left brain CVA, dominant hemisphere.  She required transfusions this year for anemia.  This could not be fully evaluated because of the degree of her dementia.  She was able to speak to the provider on this visit.   [EW]  1559 I received a call from a nurse who saw the patient today at [facility].  She noticed that the patient was last conversant today than usual, and "weaker."  She also felt that the patient had vomited because she saw emesis on her shirt.  Because of this and family members report of vomiting at home over the weekend, the patient was sent here.  Apparently the family is on the way to the emergency department, to see the patient.   [EW]    Clinical Course User Index [EW] Daleen Bo, MD     Patient Vitals for the past 24 hrs:  BP Temp Temp src Resp SpO2  12/06/17 1534 - 98.1 F (36.7 C) Rectal - -  12/06/17 1407 118/76 97.6 F (36.4 C) Axillary 16 93 %    4:00 PM Reevaluation with update and discussion. After initial assessment and treatment, an updated evaluation reveals at this time there is no change in her clinical status. Daleen Bo   Medical Decision Making: Altered mental status with vomiting.  No respiratory distress or presence of fever on arrival.  Blood pressure reassuring.  Evaluation initiated to terminal because of the mental status.  Abdomen is benign on examination.  No vomiting initially in the ED.  CRITICAL CARE-no Performed by: Daleen Bo  Nursing Notes Reviewed/ Care Coordinated Applicable Imaging  Reviewed Interpretation of Laboratory Data incorporated into ED treatment   Plan-as per oncoming provider team following completion of evaluation.  Final Clinical Impressions(s) / ED Diagnoses   Final diagnoses:  Altered mental status, unspecified altered mental status type  Nausea and vomiting, intractability of vomiting not specified, unspecified vomiting type    ED Discharge Orders    None       Daleen Bo, MD 12/06/17 [phone]

## 2017-12-07 LAB — CBC
HCT: 29.7 % — ABNORMAL LOW (ref 36.0–46.0)
Hemoglobin: 7.9 g/dL — ABNORMAL LOW (ref 12.0–15.0)
MCH: 22 pg — AB (ref 26.0–34.0)
MCHC: 26.6 g/dL — AB (ref 30.0–36.0)
MCV: 82.7 fL (ref 80.0–100.0)
PLATELETS: 408 10*3/uL — AB (ref 150–400)
RBC: 3.59 MIL/uL — ABNORMAL LOW (ref 3.87–5.11)
RDW: 18 % — AB (ref 11.5–15.5)
WBC: 19.2 10*3/uL — ABNORMAL HIGH (ref 4.0–10.5)
nRBC: 0.2 % (ref 0.0–0.2)

## 2017-12-07 LAB — URINALYSIS, ROUTINE W REFLEX MICROSCOPIC
Bilirubin Urine: NEGATIVE
GLUCOSE, UA: NEGATIVE mg/dL
HGB URINE DIPSTICK: NEGATIVE
Ketones, ur: 5 mg/dL — AB
LEUKOCYTES UA: NEGATIVE
NITRITE: NEGATIVE
PH: 5 (ref 5.0–8.0)
Protein, ur: 100 mg/dL — AB
SPECIFIC GRAVITY, URINE: 1.032 — AB (ref 1.005–1.030)

## 2017-12-07 LAB — RESPIRATORY PANEL BY PCR
Adenovirus: NOT DETECTED
BORDETELLA PERTUSSIS-RVPCR: NOT DETECTED
CORONAVIRUS OC43-RVPPCR: NOT DETECTED
Chlamydophila pneumoniae: NOT DETECTED
Coronavirus 229E: NOT DETECTED
Coronavirus HKU1: NOT DETECTED
Coronavirus NL63: NOT DETECTED
INFLUENZA A H1-RVPPCR: NOT DETECTED
INFLUENZA A-RVPPCR: NOT DETECTED
Influenza A H1 2009: NOT DETECTED
Influenza A H3: NOT DETECTED
Influenza B: NOT DETECTED
METAPNEUMOVIRUS-RVPPCR: NOT DETECTED
Mycoplasma pneumoniae: NOT DETECTED
PARAINFLUENZA VIRUS 1-RVPPCR: NOT DETECTED
PARAINFLUENZA VIRUS 2-RVPPCR: NOT DETECTED
PARAINFLUENZA VIRUS 4-RVPPCR: NOT DETECTED
Parainfluenza Virus 3: NOT DETECTED
RESPIRATORY SYNCYTIAL VIRUS-RVPPCR: NOT DETECTED
Rhinovirus / Enterovirus: NOT DETECTED

## 2017-12-07 LAB — BASIC METABOLIC PANEL
Anion gap: 10 (ref 5–15)
Anion gap: 4 — ABNORMAL LOW (ref 5–15)
BUN: 17 mg/dL (ref 8–23)
BUN: 19 mg/dL (ref 8–23)
CALCIUM: 7.3 mg/dL — AB (ref 8.9–10.3)
CALCIUM: 7.5 mg/dL — AB (ref 8.9–10.3)
CHLORIDE: 123 mmol/L — AB (ref 98–111)
CO2: 24 mmol/L (ref 22–32)
CO2: 27 mmol/L (ref 22–32)
CREATININE: 0.85 mg/dL (ref 0.44–1.00)
Chloride: 119 mmol/L — ABNORMAL HIGH (ref 98–111)
Creatinine, Ser: 0.88 mg/dL (ref 0.44–1.00)
GFR calc Af Amer: >60 mL/min (ref >60)
GFR calc Af Amer: >60 mL/min (ref >60)
GFR calc non Af Amer: 59 mL/min — ABNORMAL LOW (ref >60)
GFR, EST NON AFRICAN AMERICAN: 57 mL/min — AB (ref >60)
GLUCOSE: 129 mg/dL — AB (ref 70–99)
Glucose, Bld: 126 mg/dL — ABNORMAL HIGH (ref 70–99)
Potassium: 3.2 mmol/L — ABNORMAL LOW (ref 3.5–5.1)
Potassium: 3.5 mmol/L (ref 3.5–5.1)
SODIUM: 154 mmol/L — AB (ref 135–145)
Sodium: 153 mmol/L — ABNORMAL HIGH (ref 135–145)

## 2017-12-07 LAB — INFLUENZA PANEL BY PCR (TYPE A & B)
INFLAPCR: NEGATIVE
Influenza B By PCR: NEGATIVE

## 2017-12-07 LAB — STREP PNEUMONIAE URINARY ANTIGEN: Strep Pneumo Urinary Antigen: NEGATIVE

## 2017-12-07 LAB — GLUCOSE, CAPILLARY: Glucose-Capillary: 221 mg/dL — ABNORMAL HIGH (ref 70–99)

## 2017-12-07 MED ORDER — METOPROLOL TARTRATE 25 MG PO TABS
25.0000 mg | ORAL_TABLET | Freq: Two times a day (BID) | ORAL | Status: DC
  Administered 2017-12-07 – 2017-12-10 (×5): 25 mg via ORAL
  Filled 2017-12-07 (×5): qty 1

## 2017-12-07 MED ORDER — METOPROLOL TARTRATE 5 MG/5ML IV SOLN
5.0000 mg | Freq: Four times a day (QID) | INTRAVENOUS | Status: DC | PRN
  Administered 2017-12-07 – 2017-12-10 (×5): 5 mg via INTRAVENOUS
  Filled 2017-12-07 (×5): qty 5

## 2017-12-07 MED ORDER — HALOPERIDOL LACTATE 5 MG/ML IJ SOLN
1.0000 mg | Freq: Four times a day (QID) | INTRAMUSCULAR | Status: DC | PRN
  Administered 2017-12-08 – 2017-12-09 (×3): 1 mg via INTRAVENOUS
  Filled 2017-12-07 (×3): qty 1

## 2017-12-07 MED ORDER — METOPROLOL TARTRATE 25 MG PO TABS: 25.0000 mg | ORAL_TABLET | Freq: Two times a day (BID) | ORAL | Status: DC

## 2017-12-07 MED ORDER — DIGOXIN 0.25 MG/ML IJ SOLN
0.5000 mg | Freq: Once | INTRAMUSCULAR | Status: AC
  Administered 2017-12-07: 0.5 mg via INTRAVENOUS
  Filled 2017-12-07 (×2): qty 2

## 2017-12-07 MED ORDER — QUETIAPINE FUMARATE 25 MG PO TABS
25.0000 mg | ORAL_TABLET | Freq: Every day | ORAL | Status: DC
  Administered 2017-12-07: 25 mg via ORAL
  Filled 2017-12-07: qty 1

## 2017-12-07 NOTE — Evaluation (Signed)
Clinical/Bedside Swallow Evaluation Patient Details  Name: Brooke Griffin MRN: [id number] Date of Birth: [birth year]  Today's Date: 12/07/2017 Time: SLP Start Time (ACUTE ONLY): 1115 SLP Stop Time (ACUTE ONLY): 1200 SLP Time Calculation (min) (ACUTE ONLY): 45 min  Past Medical History:  Past Medical History:  Diagnosis Date  . Amiodarone    Amiodarone was started during hospitalization September, 2012  . Bradycardia   . Chest pain    Nuclear, hospital, September, 2012, fixed inferolateral defect and compatible with remote infarct, EF 66%, no ischemia  . Cholecystitis    Laparoscopic cholecystectomy September 20 01  . Dementia ([place])   . Diverticulosis   . Ejection fraction    EF 55-60%, echo, November 18, 2010, hypokinesis of the basal inferior wall  . GI bleed   . Hematoma    Right flank hematoma / cellulitis  September, 2012  . History of blood transfusion 09/01/2017  . Hypertension   . Peripheral arterial disease ([place])   . Permanent atrial fibrillation    previously anticoagulated with plavix by Dr Ron Parker  . Pleural effusion    Right pleural effusion, thoracentesis, October, 2012, for therapeutic reasons  . Stroke ([place])   . Symptomatic anemia 09/01/2017   Past Surgical History:  Past Surgical History:  Procedure Laterality Date  . CHOLECYSTECTOMY  11/21/10  . hysterectomy - unknown type    . PACEMAKER GENERATOR CHANGE Right 11/26/2015   Medtronic Adapta gen change by Dr Purvis Kilts  . PACEMAKER INSERTION     HPI:  84 yo female adm to [facility] diagnosed with CAP - N/V prior to admit, nonbilious posttussive emesis.  Pt has h/o CVA, bradycardia, chest pain, dysphagia diagnosed 11/2010, pleural effusion s/p thoracentesis 11/2010, bradycardia, prior smoker.  Pt resides with her daughter and [facility].  Daughter Brooke Griffin) reports pt with weight loss from 128-112 in the last six months.  Pt is on a puree/thin diet at home and per daughter she is coughing with intake.  Pt also  was treated for pna recently and is now found to have increased basilar infiltrate per imaging.  CT head negative for acute finding.     Assessment / Plan / Recommendation Clinical Impression  Patient presents with cognitive and suspected sensorimotor based dysphagia that has progressed over the last six months per daughter report.  Pt demonstrates indications of aspiration with thin via straw c/b immediate strong coughing post-swallow.  Suspect premature loss of bolus into pharynx/larynx.  Much improved tolerance of liquids via tsp and pudding but wet vocal quality continued.  Pt did not follow directions to cough/clear her throat.   Daughter is well educated regarding dementia/dysphagia and is agreeable to allow po diet for comfort.  SLP provided information re: comfort feeding including small amounts of po as pt desires only as comfortable - not for nutritional support.  Daughter agreeable to palliative referral for goals of care.  Will follow up briefly to assure all education completed.      SLP Visit Diagnosis: Dysphagia, oropharyngeal phase (R13.12)    Aspiration Risk  Severe aspiration risk;Risk for inadequate nutrition/hydration    Diet Recommendation Thin liquid;Other (Comment)(full liquids = for comfort)   Liquid Administration via: Spoon(no straw, cup boluses) Medication Administration: Crushed with puree(with pudding or jello) Supervision: Staff to assist with self feeding Compensations: Slow rate;Small sips/bites Postural Changes: Remain upright for at least 30 minutes after po intake;Seated upright at 90 degrees    Other  Recommendations Recommended Consults: Other (Comment)(palliative referral) Oral Care Recommendations: Oral  care BID   Follow up Recommendations None      Frequency and Duration min 1 x/week  1 week       Prognosis Prognosis for Safe Diet Advancement: Guarded Barriers to Reach Goals: Cognitive deficits;Time post onset;Severity of deficits;Other  (Comment)(progressive neurological disease)      Swallow Study   General Date of Onset: 12/07/17 HPI: 84 yo female adm to [facility] diagnosed with CAP - N/V prior to admit, nonbilious posttussive emesis.  Pt has h/o CVA, bradycardia, chest pain, dysphagia diagnosed 11/2010, pleural effusion s/p thoracentesis 11/2010, bradycardia, prior smoker.  Pt resides with her daughter and attends [facility].  Daughter Brooke Griffin) reports pt with weight loss from 128-112 in the last six months.  Pt is on a puree/thin diet at home and per daughter she is coughing with intake.  Pt also was treated for pna recently and is now found to have increased basilar infiltrate per imaging.  CT head negative for acute finding.   Type of Study: Bedside Swallow Evaluation Diet Prior to this Study: NPO Temperature Spikes Noted: No Respiratory Status: Nasal cannula History of Recent Intubation: No Behavior/Cognition: Alert;Doesn't follow directions Oral Cavity Assessment: Within Functional Limits Oral Care Completed by SLP: Recent completion by staff(daughter completed oral care prior to slp visit) Oral Cavity - Dentition: Other (Comment)(3 teeth total) Vision: Impaired for self-feeding(pt is legally blind) Self-Feeding Abilities: Total assist Patient Positioning: Upright in bed Baseline Vocal Quality: Low vocal intensity Volitional Cough: Cognitively unable to elicit Volitional Swallow: Unable to elicit    Oral/Motor/Sensory Function Overall Oral Motor/Sensory Function: (pt did not follow directions, ? appearance of mild facial asymmetry, able to seal lips on straw and spoon)   Ice Chips Ice chips: Not tested   Thin Liquid Thin Liquid: Impaired Presentation: Spoon;Straw Oral Phase Impairments: Reduced lingual movement/coordination;Poor awareness of bolus Oral Phase Functional Implications: Prolonged oral transit Pharyngeal  Phase Impairments: Throat Clearing - Immediate;Cough - Immediate;Wet Vocal Quality Other  Comments: pt with immediate cough post-swallow via straw - excessive cough with discomfort noted; tolerance of tsps of liquids much improved    Nectar Thick Nectar Thick Liquid: Not tested   Honey Thick Honey Thick Liquid: Not tested   Puree Puree: Impaired Presentation: Spoon Oral Phase Impairments: Reduced lingual movement/coordination Oral Phase Functional Implications: Prolonged oral transit Pharyngeal Phase Impairments: Suspected delayed Swallow   Solid     Solid: Not tested(pt on puree diet at home)      Macario Golds 12/07/2017,12:21 PM Luanna Salk, [facility] SLP [place] Pager [phone] Office [phone]
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
## 2017-12-07 NOTE — Progress Notes (Signed)
PROGRESS NOTE    Brooke Griffin  NWG:[id number] DOB: [birth year] DOA: 12/06/2017 PCP: [facility] [place]   Brief Narrative: Patient is a 84 year old female with past medical history of A. fib not on anticoagulation due to recent GI bleed, status post pacemaker placement, coronary disease, CVA, hypertension, peripheral artery disease status post right lower extremity AKA, chronic anemia, peritoneal and omental masses of uncertain etiology who presented from emergency department from [place] with complaints of nausea, vomiting and cough.  Patient was found to have right lower lobe pneumonia as per the chest x-ray done in the emergency department.  Admitted for further management.  Assessment & Plan:   Principal Problem:   Community acquired pneumonia Active Problems:   Advanced dementia ([place])   Essential hypertension   Rapid atrial fibrillation (HCC)  Severe sepsis/community acquired pneumonia: Presented with cough.  Leukocytosis, lactic acidosis.  Imaging suggestive of right lower lobe opacity.  Started treatment for community acquired pneumonia with ceftriaxone and azithromycin.  We will follow-up cultures.  Continue fluids.  She is not on home oxygen.  Currently she is maintaining her saturation with nasal cannula.  Continue bronchodilators as needed.  Possible aspiration pneumonia: This pneumonia could be from aspiration.  Speech therapy already evaluated the patient and put her on liquid diet only.  Hypokalemia/hypernatremia: Hypernatremia could be associated with dehydration.  Started on hypotonic fluids.  We will continue to check BMP.  Continue potassium supplementation  Chronic A. fib: Was on anticoagulation with Eliquis but stopped  due to concern for recent GI bleed.  Continue digoxin and Lopressor.  Continue IV Lopressor as needed.  Lactic acidosis: Continue IV fluids.  Will check levels tomorrow.  Chronic anemia: Presented with hemoglobin of  8.4.  Continue to monitor H&H. H and H dropped due to hemodilution.  She was recently admitted here in July when she presented with hemoglobin of 5.7 , at that time patient was found to have guaiac positive stools.  GI was consulted but no procedures were done due to patient's advanced dementia and plan to monitor conservatively.  Dementia: Quite advanced.  Patient visits [facility] 4 times a week.  Chronic debility/deconditioning/multiple comorbidities: Daughter requested for palliative care evaluation for possible hospice consideration.    DVT prophylaxis:Lovenox Code Status: DNR Family Communication: Daughter present at the bedside Disposition Plan: Not ready for discharge yet.    Consultants: None  Procedures: None  Antimicrobials: Ceftriaxone and azithromycin day 2  Subjective: Patient seen and examined the bedside this morning.  Feels slightly better this morning.  Patient has advanced dementia and unable to participate in meaningful communication.  Daughter present at the bedside.  Afebrile, no nausea or vomiting  Objective: Vitals:   12/06/17 1822 12/06/17 1858 12/06/17 1953 12/07/17 0816  BP: 132/78 132/78 130/72 122/69  Pulse: (!) 58 (!) 136 74 85  Resp: 15 (!) 24 (!) 22 18  Temp:   98 F (36.7 C) 98.4 F (36.9 C)  TempSrc:   Oral   SpO2: 98% 98% 94% 100%    Intake/Output Summary (Last 24 hours) at 12/07/2017 1259 Last data filed at 12/07/2017 1007 Gross per 24 hour  Intake 1300 ml  Output -  Net 1300 ml   There were no vitals filed for this visit.  Examination:  General exam: Not in distress,debilitated elderly female HEENT:PERRL,Oral mucosa dry, Ear/Nose normal on gross exam Respiratory system: Bilateral equal air entry, normal vesicular breath sounds, no wheezes or crackles  Cardiovascular system:  S1 & S2 heard, RRR. No JVD, murmurs, rubs, gallops or clicks. No pedal edema.right sided pacemaker Gastrointestinal system: Abdomen is nondistended, soft  and nontender. No organomegaly or masses felt. Normal bowel sounds heard. Central nervous system: Alert but not  oriented.  Extremities: No edema, no clubbing ,no cyanosis, distal peripheral pulses palpable.Right AKA Skin: No rashes, lesions or ulcers,no icterus ,no pallor   Data Reviewed: I have personally reviewed following labs and imaging studies  CBC: Recent Labs  Lab 12/06/17 1504 12/07/17 0200  WBC 19.5* 19.2*  NEUTROABS 18.1*  --   HGB 8.4* 7.9*  HCT 31.9* 29.7*  MCV 83.5 82.7  PLT 472* 341*   Basic Metabolic Panel: Recent Labs  Lab 12/06/17 1504 12/06/17 2047 12/07/17 0200 12/07/17 0553  NA 153* 157* 153* 154*  K 2.5* 2.5* 3.2* 3.5  CL 115* 121* 119* 123*  CO2 29 27 24 27   GLUCOSE 118* 144* 129* 126*  BUN 19 19 17 19   CREATININE 0.95 0.90 0.88 0.85  CALCIUM 7.9* 7.7* 7.3* 7.5*  MG  --  2.0  --   --   PHOS  --  2.3*  --   --    GFR: CrCl cannot be calculated (Unknown ideal weight.). Liver Function Tests: Recent Labs  Lab 12/06/17 1504  AST 19  ALT 10  ALKPHOS 63  BILITOT 1.4*  PROT 5.8*  ALBUMIN 1.9*   No results for input(s): LIPASE, AMYLASE in the last 168 hours. Recent Labs  Lab 12/06/17 1700  AMMONIA 26   Coagulation Profile: No results for input(s): INR, PROTIME in the last 168 hours. Cardiac Enzymes: No results for input(s): CKTOTAL, CKMB, CKMBINDEX, TROPONINI in the last 168 hours. BNP (last 3 results) No results for input(s): PROBNP in the last 8760 hours. HbA1C: No results for input(s): HGBA1C in the last 72 hours. CBG: Recent Labs  Lab 12/06/17 1405 12/07/17 0816  GLUCAP 139* 221*   Lipid Profile: No results for input(s): CHOL, HDL, LDLCALC, TRIG, CHOLHDL, LDLDIRECT in the last 72 hours. Thyroid Function Tests: Recent Labs    12/06/17 2047  TSH 2.168   Anemia Panel: No results for input(s): VITAMINB12, FOLATE, FERRITIN, TIBC, IRON, RETICCTPCT in the last 72 hours. Sepsis Labs: Recent Labs  Lab 12/06/17 1603  12/06/17 2151  LATICACIDVEN 2.28* 2.0*    Recent Results (from the past 240 hour(s))  Respiratory Panel by PCR     Status: None   Collection Time: 12/07/17  1:22 AM  Result Value Ref Range Status   Adenovirus NOT DETECTED NOT DETECTED Final   Coronavirus 229E NOT DETECTED NOT DETECTED Final   Coronavirus HKU1 NOT DETECTED NOT DETECTED Final   Coronavirus NL63 NOT DETECTED NOT DETECTED Final   Coronavirus OC43 NOT DETECTED NOT DETECTED Final   Metapneumovirus NOT DETECTED NOT DETECTED Final   Rhinovirus / Enterovirus NOT DETECTED NOT DETECTED Final   Influenza A NOT DETECTED NOT DETECTED Final   Influenza A H1 NOT DETECTED NOT DETECTED Final   Influenza A H1 2009 NOT DETECTED NOT DETECTED Final   Influenza A H3 NOT DETECTED NOT DETECTED Final   Influenza B NOT DETECTED NOT DETECTED Final   Parainfluenza Virus 1 NOT DETECTED NOT DETECTED Final   Parainfluenza Virus 2 NOT DETECTED NOT DETECTED Final   Parainfluenza Virus 3 NOT DETECTED NOT DETECTED Final   Parainfluenza Virus 4 NOT DETECTED NOT DETECTED Final   Respiratory Syncytial Virus NOT DETECTED NOT DETECTED Final   Bordetella pertussis NOT DETECTED NOT DETECTED  Final   Chlamydophila pneumoniae NOT DETECTED NOT DETECTED Final   Mycoplasma pneumoniae NOT DETECTED NOT DETECTED Final    Comment: Performed at [facility], [place] [address], [place], [place] [address]         Radiology Studies: Ct Head Wo Contrast  Result Date: 12/06/2017 CLINICAL DATA:  84 year old female with a history of vomiting EXAM: CT HEAD WITHOUT CONTRAST TECHNIQUE: Contiguous axial images were obtained from the base of the skull through the vertex without intravenous contrast. COMPARISON:  06/01/2013 FINDINGS: Brain: No acute intracranial hemorrhage. No midline shift or mass effect. Gray-white differentiation relatively maintained. Confluent hypodensity in the periventricular white matter, similar to the prior though appears to have progressed.  Diffuse volume loss with expansion of the ventricles. Vascular: Calcifications of the intracranial vasculature. Skull: No acute displaced fracture.  No focal soft tissue swelling Sinuses/Orbits: No acute finding. Other: None IMPRESSION: Negative for acute intracranial finding. Evidence of chronic microvascular ischemic disease, progressed from the prior CT, with senescent brain volume loss. Electronically Signed   By: Corrie Mckusick D.O.   On: 12/06/2017 16:30   Dg Chest Port 1 View  Result Date: 12/06/2017 CLINICAL DATA:  Shortness of breath, altered mental status. EXAM: PORTABLE CHEST 1 VIEW COMPARISON:  Radiographs of October 27, 2017. FINDINGS: Stable cardiomegaly. Atherosclerosis of thoracic aorta is noted. Single lead right-sided pacemaker is unchanged in position. No pneumothorax is noted. Increased right basilar opacity is noted concerning for edema or infiltrate with associated pleural effusion. Mild left basilar subsegmental atelectasis or edema is noted. Bony thorax is unremarkable. IMPRESSION: Increased right basilar opacity is noted concerning for worsening edema or pneumonia with associated pleural effusion. Aortic Atherosclerosis (ICD10-I70.0). Electronically Signed   By: Marijo Conception, M.D.   On: 12/06/2017 15:24        Scheduled Meds: . [START ON 12/08/2017] digoxin  0.125 mg Oral QODAY  . docusate sodium  100 mg Oral BID  . enoxaparin (LOVENOX) injection  30 mg Subcutaneous Q24H  . escitalopram  10 mg Oral QHS  . metoprolol tartrate  25 mg Oral BID  . mirtazapine  15 mg Oral QHS  . potassium chloride  40 mEq Oral Once  . senna  1 tablet Oral BID   Continuous Infusions: . azithromycin 500 mg (12/07/17 0023)  . cefTRIAXone (ROCEPHIN)  IV 1 g (12/06/17 2306)  . dextrose 5 % and 0.2 % NaCl with KCl 20 mEq 75 mL/hr at 12/06/17 2303  . sodium chloride       LOS: 1 day    Time spent: 35 mins.More than 50% of that time was spent in counseling and/or coordination of  care.      Shelly Coss, MD [facility] Pager [phone]  If 7PM-7AM, please contact night-coverage [email or web address] Password [email or web address] 12/07/2017, 12:59 PM

## 2017-12-07 NOTE — Progress Notes (Signed)
PT Cancellation Note  Patient Details Name: Brooke Griffin MRN: [id number] DOB: 1925-12-15   Cancelled Treatment:    Reason Eval/Treat Not Completed: PT screened, no needs identified, will sign off(spoke with pt's daughter who is primary caregiver. At baseline pt is WC/bed bound (pt had been able to assist with transfers until 6 months ago), daughter lifts pt into [facility] and assists her with ADLs. Pt attends day program at [facility] 4x/week and has an aide at home. Pt has needed DME and is at baseline of dependence for mobility, PT not indicated,  will sign off. )   Philomena Doheny PT 12/07/2017  Acute Rehabilitation Services Pager [phone] Office [phone]

## 2017-12-08 DIAGNOSIS — D500 Iron deficiency anemia secondary to blood loss (chronic): Secondary | ICD-10-CM

## 2017-12-08 DIAGNOSIS — Z7189 Other specified counseling: Secondary | ICD-10-CM

## 2017-12-08 DIAGNOSIS — Z515 Encounter for palliative care: Secondary | ICD-10-CM

## 2017-12-08 DIAGNOSIS — I4891 Unspecified atrial fibrillation: Secondary | ICD-10-CM

## 2017-12-08 DIAGNOSIS — F0280 Dementia in other diseases classified elsewhere without behavioral disturbance: Secondary | ICD-10-CM

## 2017-12-08 DIAGNOSIS — F0390 Unspecified dementia without behavioral disturbance: Secondary | ICD-10-CM

## 2017-12-08 DIAGNOSIS — I10 Essential (primary) hypertension: Secondary | ICD-10-CM

## 2017-12-08 LAB — CBC WITH DIFFERENTIAL/PLATELET
ABS IMMATURE GRANULOCYTES: 0.12 10*3/uL — AB (ref 0.00–0.07)
BASOS ABS: 0 10*3/uL (ref 0.0–0.1)
Basophils Relative: 0 %
Eosinophils Absolute: 0.2 10*3/uL (ref 0.0–0.5)
Eosinophils Relative: 1 %
HCT: 27.4 % — ABNORMAL LOW (ref 36.0–46.0)
HEMOGLOBIN: 7.3 g/dL — AB (ref 12.0–15.0)
Immature Granulocytes: 1 %
LYMPHS PCT: 9 %
Lymphs Abs: 1.2 10*3/uL (ref 0.7–4.0)
MCH: 22 pg — ABNORMAL LOW (ref 26.0–34.0)
MCHC: 26.6 g/dL — ABNORMAL LOW (ref 30.0–36.0)
MCV: 82.5 fL (ref 80.0–100.0)
MONO ABS: 0.5 10*3/uL (ref 0.1–1.0)
Monocytes Relative: 4 %
NEUTROS ABS: 10.6 10*3/uL — AB (ref 1.7–7.7)
NRBC: 0.2 % (ref 0.0–0.2)
Neutrophils Relative %: 85 %
Platelets: 392 10*3/uL (ref 150–400)
RBC: 3.32 MIL/uL — AB (ref 3.87–5.11)
RDW: 17.8 % — ABNORMAL HIGH (ref 11.5–15.5)
WBC: 12.5 10*3/uL — AB (ref 4.0–10.5)

## 2017-12-08 LAB — BASIC METABOLIC PANEL
Anion gap: 7 (ref 5–15)
BUN: 14 mg/dL (ref 8–23)
CHLORIDE: 119 mmol/L — AB (ref 98–111)
CO2: 25 mmol/L (ref 22–32)
Calcium: 7.6 mg/dL — ABNORMAL LOW (ref 8.9–10.3)
Creatinine, Ser: 0.88 mg/dL (ref 0.44–1.00)
GFR calc Af Amer: >60 mL/min (ref >60)
GFR calc non Af Amer: 57 mL/min — ABNORMAL LOW (ref >60)
Glucose, Bld: 122 mg/dL — ABNORMAL HIGH (ref 70–99)
POTASSIUM: 3.3 mmol/L — AB (ref 3.5–5.1)
SODIUM: 151 mmol/L — AB (ref 135–145)

## 2017-12-08 LAB — GLUCOSE, CAPILLARY
GLUCOSE-CAPILLARY: 120 mg/dL — AB (ref 70–99)
Glucose-Capillary: 106 mg/dL — ABNORMAL HIGH (ref 70–99)

## 2017-12-08 LAB — LACTIC ACID, PLASMA: LACTIC ACID, VENOUS: 1.3 mmol/L (ref 0.5–1.9)

## 2017-12-08 LAB — PROCALCITONIN: PROCALCITONIN: 0.11 ng/mL

## 2017-12-08 MED ORDER — POTASSIUM CHLORIDE 10 MEQ/100ML IV SOLN
10.0000 meq | INTRAVENOUS | Status: AC
  Administered 2017-12-08 (×3): 10 meq via INTRAVENOUS
  Filled 2017-12-08 (×3): qty 100

## 2017-12-08 MED ORDER — QUETIAPINE FUMARATE 25 MG PO TABS
25.0000 mg | ORAL_TABLET | Freq: Every evening | ORAL | Status: DC | PRN
  Administered 2017-12-10: 25 mg via ORAL
  Filled 2017-12-08: qty 1

## 2017-12-08 MED ORDER — SODIUM CHLORIDE 0.9 % IV SOLN
3.0000 g | Freq: Two times a day (BID) | INTRAVENOUS | Status: DC
  Administered 2017-12-08 – 2017-12-10 (×4): 3 g via INTRAVENOUS
  Filled 2017-12-08 (×4): qty 3

## 2017-12-08 MED ORDER — DEXTROSE 5 % IV SOLN
INTRAVENOUS | Status: DC
  Administered 2017-12-08 – 2017-12-09 (×2): via INTRAVENOUS

## 2017-12-08 MED ORDER — RISPERIDONE 0.25 MG PO TABS
0.2500 mg | ORAL_TABLET | Freq: Two times a day (BID) | ORAL | Status: DC
  Administered 2017-12-08 (×2): 0.25 mg via ORAL
  Filled 2017-12-08 (×2): qty 1

## 2017-12-08 NOTE — Progress Notes (Signed)
Nutrition Brief Note  Chart reviewed. Pt's family wants to focus on comfort per palliative care note. Pt does not want a feeding tube. No nutrition interventions warranted at this time.   Clayton Bibles, MS, RD, [place] Dietitian Pager: [phone] After Hours Pager: [phone]

## 2017-12-08 NOTE — Progress Notes (Signed)
Patient runs 5 beats of Vtach. Paged MD at [facility]. Waiting for order.

## 2017-12-08 NOTE — Consult Note (Signed)
Consultation Note Date: 12/08/2017   Patient Name: Brooke Griffin  DOB: 1925-10-21  MRN: [id number]  Age / Sex: 84 y.o., female  PCP: [facility] [place] Referring Physician: Patrecia Pour, Christean Grief, MD  Reason for Consultation: Establishing goals of care and Psychosocial/spiritual support  HPI/Patient Profile: 84 y.o. female  with past medical history of CVA, dementia, peripheral arterial disease, permanent atrial fibrillation, bradycardia and pacemaker placement who was admitted on 12/06/2017 with nausea, vomiting and cough.  She was found to be septic with community acquired pneumonia.  Brooke Griffin had a recent admission for symptomatic anemia in July 2019.    Clinical Assessment and Goals of Care:  I have reviewed medical records including EPIC notes, labs and imaging, received report from the care team, assessed the patient and then met at the bedside along with her daughter Brooke Griffin to discuss diagnosis prognosis, [place], EOL wishes, disposition and options.  I introduced Palliative Medicine as specialized medical care for people living with serious illness. It focuses on providing relief from the symptoms and stress of a serious illness. The goal is to improve quality of life for both the patient and the family.  We discussed a brief life review of the patient. She was a full time mother.  Biologically Brooke Griffin is her niece, but she adopted her at 23 months old.  She has no other children.  She was 1 of 8 siblings and is the last one remaining.   Her husband passed 18 years ago.  Brooke Griffin is at home full time and is her mother's care taker.  Brooke Griffin also cares for a 82 year old and 58 year in her home.    As far as functional and nutritional status, Brooke Griffin has been wheel chair bound for 6 months.  During that same time frame she has also lost approximately 30 pounds.  She is  completely dependent for all ADLs.  Her speech is mostly unintelligible and she sleeps most of the day.  She refuses to eat or drink more than sips and bites.   She has been hospitalized twice in the last 3 months and has recurrent aspiration pneumonia.  In July she had heme positive stool and a hemoglobin of 5.7.  Invasive work up was not recommended and anti-coagulation was discontinued.  Currently her hgb is 7.3.  Family is in support of comfort measures and does not favor further transfusion.  We discussed her current illness and what it means in the larger context of her on-going co-morbidities.  Natural disease trajectory and expectations at EOL were discussed.  Advanced directives, concepts specific to code status, artifical feeding and hydration, and rehospitalization were considered and discussed.  Brooke Griffin is a DNR.  Her daughter is not in favor of any aggressive measures including returning to the hospital or having a PEG placed.  Hospice and Palliative Care services outpatient were explained and offered.  We discussed receiving services via [facility] directly vs continuing with [facility] and receiving Hospice like services thru [facility].  I set up a conference call with the [facility] social worker and the three of [place] discussed their services.  Brooke. Brooke Griffin would like to talk with [facility] as well.  Questions and concerns were addressed.  The family was encouraged to call with questions or concerns.    Primary Decision Maker:  NEXT OF KIN daughter, Brooke Griffin    SUMMARY OF RECOMMENDATIONS    Discharge to home at earliest possible point (as soon as IV antibiotics are completed).  Patient tends to develop Hospital delirium.  Discharge to home with either Hospice services directly thru [facility] or Hospice like services thru [facility].  Family wants NO further Blood Transfusions, no re-hospitalization or procedures.  Rather they would like to focus on her comfort.   Code Status/Advance Care  Planning:  DNR   Symptom Management:   Delirium precautions added.  Additional Recommendations (Limitations, Scope, Preferences):  Avoid Hospitalization, Minimize Medications, No Artificial Feeding, No Blood Transfusions and No Glucose Monitoring  Palliative Prophylaxis:   Delirium Protocol  Psycho-social/Spiritual:   Desire for further Chaplaincy support: not requested at this point.  Prognosis:  Given recurrent aspiration pneumonia, GI bleeding with no further blood transfusions, minimal PO intake, complete dependence for ADLs, rapid weight loss, less than 6 months.    Discharge Planning: Home with Hospice or hospice like services thru [facility].      Primary Diagnoses: Present on Admission: . Community acquired pneumonia . Advanced dementia ([place]) . Essential hypertension . Rapid atrial fibrillation ([place])   I have reviewed the medical record, interviewed the patient and family, and examined the patient. The following aspects are pertinent.  Past Medical History:  Diagnosis Date  . Amiodarone    Amiodarone was started during hospitalization September, 2012  . Bradycardia   . Chest pain    Nuclear, hospital, September, 2012, fixed inferolateral defect and compatible with remote infarct, EF 66%, no ischemia  . Cholecystitis    Laparoscopic cholecystectomy September 20 01  . Dementia ([place])   . Diverticulosis   . Ejection fraction    EF 55-60%, echo, November 18, 2010, hypokinesis of the basal inferior wall  . GI bleed   . Hematoma    Right flank hematoma / cellulitis  September, 2012  . History of blood transfusion 09/01/2017  . Hypertension   . Peripheral arterial disease ([place])   . Permanent atrial fibrillation    previously anticoagulated with plavix by Dr Ron Parker  . Pleural effusion    Right pleural effusion, thoracentesis, October, 2012, for therapeutic reasons  . Stroke ([place])   . Symptomatic anemia 09/01/2017   Social History   Socioeconomic History  .  Marital status: Widowed    Spouse name: Not on file  . Number of children: Not on file  . Years of education: Not on file  . Highest education level: Not on file  Occupational History  . Not on file  Social Needs  . Financial resource strain: Not on file  . Food insecurity:    Worry: Not on file    Inability: Not on file  . Transportation needs:    Medical: Not on file    Non-medical: Not on file  Tobacco Use  . Smoking status: Former Research scientist (life sciences)  . Smokeless tobacco: Never Used  Substance and Sexual Activity  . Alcohol use: Yes    Alcohol/week: 0.0 standard drinks    Comment: occasional  . Drug use: No  . Sexual activity: Not on file  Lifestyle  . Physical activity:  Days per week: Not on file    Minutes per session: Not on file  . Stress: Not on file  Relationships  . Social connections:    Talks on phone: Not on file    Gets together: Not on file    Attends religious service: Not on file    Active member of club or organization: Not on file    Attends meetings of clubs or organizations: Not on file    Relationship status: Not on file  Other Topics Concern  . Not on file  Social History Narrative  . Not on file   Family History  Problem Relation Age of Onset  . Hypertension Mother   . Heart disease Mother        heart attack  . Heart disease Unknown   . Hypertension Unknown    Scheduled Meds: . digoxin  0.125 mg Oral QODAY  . docusate sodium  100 mg Oral BID  . enoxaparin (LOVENOX) injection  30 mg Subcutaneous Q24H  . metoprolol tartrate  25 mg Oral BID  . potassium chloride  40 mEq Oral Once  . risperiDONE  0.25 mg Oral BID  . senna  1 tablet Oral BID   Continuous Infusions: . azithromycin Stopped (12/07/17 2237)  . cefTRIAXone (ROCEPHIN)  IV Stopped (12/08/17 0019)  . dextrose 5 % and 0.2 % NaCl with KCl 20 mEq 75 mL/hr at 12/08/17 0646  . potassium chloride 10 mEq (12/08/17 1138)  . sodium chloride     PRN Meds:.acetaminophen **OR** acetaminophen,  haloperidol lactate, ipratropium, metoprolol tartrate, QUEtiapine Allergies  Allergen Reactions  . Donepezil Nausea And Vomiting   Review of Systems patient demented.  Speech unintelligible  Physical Exam  Pleasantly demented elderly female, awake, NAD CV irreg, irreg Resp no distress, no w/c/r Abdomen soft, nt, nd, +bs RLE amputated.  Vital Signs: BP 127/68   Pulse 99   Temp 97.9 F (36.6 C) (Oral)   Resp 18   SpO2 94%  Pain Scale: 0-10   Pain Score: 0-No pain   SpO2: SpO2: 94 % O2 Device:SpO2: 94 % O2 Flow Rate: .   IO: Intake/output summary:   Intake/Output Summary (Last 24 hours) at 12/08/2017 1254 Last data filed at 12/08/2017 1138 Gross per 24 hour  Intake 2095.25 ml  Output 375 ml  Net 1720.25 ml    LBM: Last BM Date: (pta) Baseline Weight:   Most recent weight:       Palliative Assessment/Data: 20%     Time In: 12:00 Time Out: 1:10 Time Total: 70 min. Greater than 50%  of this time was spent counseling and coordinating care related to the above assessment and plan.  Signed by: Florentina Jenny, PA-C Palliative Medicine Pager: [phone]  Please contact Palliative Medicine Team phone at [phone] for questions and concerns.  For individual provider: See Shea Evans

## 2017-12-08 NOTE — Progress Notes (Signed)
Pharmacy Antibiotic Note  Brooke Griffin is a 84 y.o. female admitted on 12/06/2017 with UTI and PNA Pharmacy has been consulted for Unasyn dosing.  Plan: Unasyn 3 gr IV q12h   Azithromycin 500 mg IV q24h     Temp (24hrs), Avg:98 F (36.7 C), Min:97.9 F (36.6 C), Max:98 F (36.7 C)  Recent Labs  Lab 12/06/17 1504 12/06/17 1603 12/06/17 2047 12/06/17 2151 12/07/17 0200 12/07/17 0553 12/08/17 0611  WBC 19.5*  --   --   --  19.2*  --  12.5*  CREATININE 0.95  --  0.90  --  0.88 0.85 0.88  LATICACIDVEN  --  2.28*  --  2.0*  --   --  1.3    CrCl cannot be calculated (Unknown ideal weight.).    Allergies  Allergen Reactions  . Donepezil Nausea And Vomiting    Antimicrobials this admission: 10/14 ceftriaxone >> 10/16 10/14 azithromycin >>  10/16 Unasyn  Dose adjustments this admission:   Microbiology results: 10/14 BCx: NGTD 10/15 UCx: E.faecalis   10/15 Resp Panel: negative     Thank you for allowing pharmacy to be a part of this patient's care.   Royetta Asal, PharmD, BCPS Pager [phone] 12/08/2017 2:37 PM

## 2017-12-08 NOTE — Progress Notes (Signed)
HR = 154, on telemetry; paged MD. Patient has history of a-fib, CAD. Rn will monitor patient and waiting for new order.

## 2017-12-08 NOTE — Progress Notes (Signed)
PROGRESS NOTE [facility]   Brooke Griffin   VOJ:[id number] DOB: [birth year]  DOA: 12/06/2017 PCP: [facility]   Brief Narrative:  Brooke Griffin is a 84 year old female with past medical history of A. 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  Upon ED evaluation chest x-ray showed right lower lobe pneumonia and patient was admitted with working diagnosis of sepsis due to right lower lobe pneumonia.  Subjective: Patient seen and examined, she is more alert this morning.  Denies any nausea, vomiting, chest pain or difficulty breathing.  Remains afebrile.  No acute events overnight.  Assessment & Plan: Severe sepsis due to pneumonia and UTI. - sepsis physiology improved  Felt to be secondary to community-acquired versus possible aspiration.  Speech evaluation showed patient with severe risk of aspiration.  Patient currently on ceftriaxone and azithromycin.  Since patient has high risk for aspiration will switch antibiotic to Unasyn, this will also cover UTI which she is growing Enterococcus faecalis.  Will check procalcitonin.  Aspiration pneumonia See above.  UTI Urine culture growing Enterococcus faecalis.  Await sensitivities.  Patient placed on Unasyn.  Hypokalemia Replete as needed, keep K above 4, check mag in AM   Mild Hypernatremia  Improved, due to dehydration. Will continue gentle d5 hydration monitor renal function and electrolytes   Dementia with hospital acquired delirium Patient having episodes of agitation overnight.  Will start risperidone 0.25 mg twice daily and keep Seroquel nightly.  Okay to use Haldol if Seroquel does not work.  Keep room bright during the day.  Chronic A. fib Heart  rate slightly elevated, continue digoxin and Lopressor.  Also IV Lopressor as needed for heart rate above 120.  Patient not on anticoagulation at this has been stopped due to concerns of GI bleed.  Chronic anemia Felt to be multifactorial, including GI bleed and iron deficiency.  Current hemoglobin 7.3, family does not wishes for further blood transfusion.  Will continue to monitor for now.  DC Lovenox.  Deconditioning With multiple comorbidities and has been declining over time.  Now with severe aspiration risk.  Palliative care was consulted and family has opted to focus on comfort measure after infectious process has treated.  Patient will be discharged with hospice care when ready.  DVT prophylaxis: SCD's Code Status: DNR Family Communication: Daughter at bedside  Disposition Plan: Home in 1-2 days   Consultants:   Palliative care  Procedures:   None   Antimicrobials: Anti-infectives (From admission, onward)   Start     Dose/Rate Route Frequency Ordered Stop   12/08/17 1600  Ampicillin-Sulbactam (UNASYN) 3 g in sodium chloride 0.9 % 100 mL IVPB     3 g 200 mL/hr over 30 Minutes Intravenous Every 12 hours 12/08/17 1434     12/08/17 0600  vancomycin (VANCOCIN) IVPB 750 mg/150 ml premix  Status:  Discontinued     750 mg 150 mL/hr over 60 Minutes Intravenous Every 36 hours 12/06/17 1717 12/06/17 1958   12/07/17 1800  ceFEPIme (MAXIPIME) 1 g in sodium chloride 0.9 % 100 mL IVPB  Status:  Discontinued     1 g 200 mL/hr over 30 Minutes Intravenous Every 24 hours 12/06/17 1717 12/06/17 1958   12/06/17 2245  azithromycin (ZITHROMAX) 500 mg in sodium chloride 0.9 % 250  mL IVPB     500 mg 250 mL/hr over 60 Minutes Intravenous Daily at bedtime 12/06/17 2235 12/11/17 2159   12/06/17 2200  cefTRIAXone (ROCEPHIN) 1 g in sodium chloride 0.9 % 100 mL IVPB  Status:  Discontinued     1 g 200 mL/hr over 30 Minutes Intravenous Every 24 hours 12/06/17 1958 12/08/17 1424   12/06/17 2200   azithromycin (ZITHROMAX) tablet 500 mg  Status:  Discontinued     500 mg Oral Every 24 hours 12/06/17 1958 12/06/17 2235   12/06/17 1715  vancomycin (VANCOCIN) IVPB 1000 mg/200 mL premix     1,000 mg 200 mL/hr over 60 Minutes Intravenous  Once 12/06/17 1700 12/06/17 1854   12/06/17 1715  ceFEPIme (MAXIPIME) 2 g in sodium chloride 0.9 % 100 mL IVPB     2 g 200 mL/hr over 30 Minutes Intravenous  Once 12/06/17 1700 12/06/17 1853       Objective: Vitals:   12/08/17 0022 12/08/17 0547 12/08/17 0700 12/08/17 1100  BP: 108/77 127/68    Pulse: 100 (!) 118 (!) 154 99  Resp: 15 15  18   Temp: 98 F (36.7 C) 97.9 F (36.6 C)    TempSrc: Oral Oral    SpO2: 90% 94%      Intake/Output Summary (Last 24 hours) at 12/08/2017 1416 Last data filed at 12/08/2017 1138 Gross per 24 hour  Intake 2095.25 ml  Output 375 ml  Net 1720.25 ml   There were no vitals filed for this visit.  Examination:  General exam: NAD, pleasant  Respiratory system: Decreased breath sounds, bilaterally, no wheezing or crackles Cardiovascular system: S1 & S2 heard, RRR. No JVD, murmurs, rubs or gallops Gastrointestinal system: Abdomen is nondistended, soft and nontender.  Central nervous system: Pleasantly confused, nonfocal Extremities: Right AKA, no left lower extremity edema Skin: No rashes Psychiatry: Unable to evaluate   Data Reviewed: I have personally reviewed following labs and imaging studies  CBC: Recent Labs  Lab 12/06/17 1504 12/07/17 0200 12/08/17 0611  WBC 19.5* 19.2* 12.5*  NEUTROABS 18.1*  --  10.6*  HGB 8.4* 7.9* 7.3*  HCT 31.9* 29.7* 27.4*  MCV 83.5 82.7 82.5  PLT 472* 408* 476   Basic Metabolic Panel: Recent Labs  Lab 12/06/17 1504 12/06/17 2047 12/07/17 0200 12/07/17 0553 12/08/17 0611  NA 153* 157* 153* 154* 151*  K 2.5* 2.5* 3.2* 3.5 3.3*  CL 115* 121* 119* 123* 119*  CO2 29 27 24 27 25   GLUCOSE 118* 144* 129* 126* 122*  BUN 19 19 17 19 14   CREATININE 0.95 0.90 0.88  0.85 0.88  CALCIUM 7.9* 7.7* 7.3* 7.5* 7.6*  MG  --  2.0  --   --   --   PHOS  --  2.3*  --   --   --    GFR: CrCl cannot be calculated (Unknown ideal weight.). Liver Function Tests: Recent Labs  Lab 12/06/17 1504  AST 19  ALT 10  ALKPHOS 63  BILITOT 1.4*  PROT 5.8*  ALBUMIN 1.9*   No results for input(s): LIPASE, AMYLASE in the last 168 hours. Recent Labs  Lab 12/06/17 1700  AMMONIA 26   Coagulation Profile: No results for input(s): INR, PROTIME in the last 168 hours. Cardiac Enzymes: No results for input(s): CKTOTAL, CKMB, CKMBINDEX, TROPONINI in the last 168 hours. BNP (last 3 results) No results for input(s): PROBNP in the last 8760 hours. HbA1C: No results for input(s): HGBA1C in the last 72 hours. CBG: Recent  Labs  Lab 12/06/17 1405 12/07/17 0816 12/08/17 0743 12/08/17 1117  GLUCAP 139* 221* 106* 120*   Lipid Profile: No results for input(s): CHOL, HDL, LDLCALC, TRIG, CHOLHDL, LDLDIRECT in the last 72 hours. Thyroid Function Tests: Recent Labs    12/06/17 2047  TSH 2.168   Anemia Panel: No results for input(s): VITAMINB12, FOLATE, FERRITIN, TIBC, IRON, RETICCTPCT in the last 72 hours. Sepsis Labs: Recent Labs  Lab 12/06/17 1603 12/06/17 2151 12/08/17 0611  LATICACIDVEN 2.28* 2.0* 1.3    Recent Results (from the past 240 hour(s))  Culture, blood (routine x 2)     Status: None (Preliminary result)   Collection Time: 12/06/17  3:56 PM  Result Value Ref Range Status   Specimen Description   Final    BLOOD RIGHT ANTECUBITAL Performed at [facility], [place] [address], [place], [place] [address]    Special Requests   Final    BOTTLES DRAWN AEROBIC AND ANAEROBIC Blood Culture results may not be optimal due to an inadequate volume of blood received in culture bottles Performed at [place] [address], [place], [place] [address]    Culture   Final    NO GROWTH 2 DAYS Performed at [place] [address], [place], [place] [address]    Report Status PENDING  Incomplete  Culture, blood (routine x 2)     Status: None (Preliminary result)   Collection Time: 12/06/17  5:05 PM  Result Value Ref Range Status   Specimen Description   Final    BLOOD RIGHT HAND Performed at [place] [address] [place], [place] [address]    Special Requests   Final    BOTTLES DRAWN AEROBIC AND ANAEROBIC Blood Culture adequate volume Performed at [place] [address] [place], [place] [address]    Culture   Final    NO GROWTH 2 DAYS Performed at [place] [address] [place], [place] [address]    Report Status PENDING  Incomplete  Respiratory Panel by PCR     Status: None   Collection Time: 12/07/17  1:22 AM  Result Value Ref Range Status   Adenovirus NOT DETECTED NOT DETECTED Final   Coronavirus 229E NOT DETECTED NOT DETECTED Final   Coronavirus HKU1 NOT DETECTED NOT DETECTED Final   Coronavirus NL63 NOT DETECTED NOT DETECTED Final   Coronavirus OC43 NOT DETECTED NOT DETECTED Final   Metapneumovirus NOT DETECTED NOT DETECTED Final   Rhinovirus / Enterovirus NOT DETECTED NOT DETECTED Final   Influenza A NOT DETECTED NOT DETECTED Final   Influenza A H1 NOT DETECTED NOT DETECTED Final   Influenza A H1 2009 NOT DETECTED NOT DETECTED Final   Influenza A H3 NOT DETECTED NOT DETECTED Final   Influenza B NOT DETECTED NOT DETECTED Final   Parainfluenza Virus 1 NOT DETECTED NOT DETECTED Final   Parainfluenza Virus 2 NOT DETECTED NOT DETECTED Final   Parainfluenza Virus 3 NOT DETECTED NOT DETECTED Final   Parainfluenza Virus 4 NOT DETECTED NOT DETECTED Final   Respiratory Syncytial Virus NOT DETECTED NOT DETECTED Final   Bordetella pertussis NOT DETECTED NOT DETECTED Final   Chlamydophila pneumoniae NOT DETECTED NOT DETECTED Final   Mycoplasma pneumoniae NOT DETECTED NOT DETECTED Final    Comment: Performed at [place], [place] [address], [place], [place] [address]  Urine culture     Status: Abnormal (Preliminary result)   Collection Time:  12/07/17  7:00 AM  Result Value Ref Range Status   Specimen Description   Final    URINE, RANDOM Performed at [facility], [place] [address], [place], [place] [address]    Special Requests   Final    NONE Performed at [facility], [place] [address] [place], [place] [address]    Culture >=100,000 COLONIES/mL ENTEROCOCCUS FAECALIS (A)  Final   Report Status PENDING  Incomplete      Radiology Studies: Ct Head Wo Contrast  Result Date: 12/06/2017 CLINICAL DATA:  84 year old female with a history of vomiting EXAM: CT HEAD WITHOUT CONTRAST TECHNIQUE: Contiguous axial images were obtained from the base of the skull through the vertex without intravenous contrast. COMPARISON:  06/01/2013 FINDINGS: Brain: No acute intracranial hemorrhage. No midline shift or mass effect. Gray-white differentiation relatively maintained. Confluent hypodensity in the periventricular white matter, similar to the prior though appears to have progressed. Diffuse volume loss with expansion of the ventricles. Vascular: Calcifications of the intracranial vasculature. Skull: No acute displaced fracture.  No focal soft tissue swelling Sinuses/Orbits: No acute finding. Other: None IMPRESSION: Negative for acute intracranial finding. Evidence of chronic microvascular ischemic disease, progressed from the prior CT, with senescent brain volume loss. Electronically Signed   By: Corrie Mckusick D.O.   On: 12/06/2017 16:30   Dg Chest Port 1 View  Result Date: 12/06/2017 CLINICAL DATA:  Shortness of breath, altered mental status. EXAM: PORTABLE CHEST 1 VIEW COMPARISON:  Radiographs of October 27, 2017. FINDINGS: Stable cardiomegaly. Atherosclerosis of thoracic aorta is noted. Single lead right-sided pacemaker is unchanged in position. No pneumothorax is noted. Increased right basilar  opacity is noted concerning for edema or infiltrate with associated pleural effusion. Mild left basilar subsegmental atelectasis or edema is noted. Bony thorax is unremarkable. IMPRESSION: Increased right basilar opacity is noted concerning for worsening edema or pneumonia with associated pleural effusion. Aortic Atherosclerosis (ICD10-I70.0). Electronically Signed   By: Marijo Conception, M.D.   On: 12/06/2017 15:24      Scheduled Meds: . digoxin  0.125 mg Oral QODAY  . docusate sodium  100 mg Oral BID  . enoxaparin (LOVENOX) injection  30 mg Subcutaneous Q24H  . metoprolol tartrate  25 mg Oral BID  . potassium chloride  40 mEq Oral Once  . risperiDONE  0.25 mg Oral BID  . senna  1 tablet Oral BID   Continuous Infusions: . azithromycin Stopped (12/07/17 2237)  . cefTRIAXone (ROCEPHIN)  IV Stopped (12/08/17 0019)  . sodium chloride       LOS: 2 days    Time spent: Total of 35 minutes spent with pt, greater than 50% of which was spent in discussion of  treatment, counseling and coordination of care   Chipper Oman, MD Pager: Text Page via [email or web address]   If 7PM-7AM, please contact night-coverage [email or web address] 12/08/2017, 2:16 PM   Note - This record has been created using [employer]. Chart creation errors have been sought, but may not always have been located. Such creation errors do not reflect on the standard of medical care.

## 2017-12-09 LAB — CBC WITH DIFFERENTIAL/PLATELET
Abs Immature Granulocytes: 0.12 10*3/uL — ABNORMAL HIGH (ref 0.00–0.07)
BASOS ABS: 0 10*3/uL (ref 0.0–0.1)
BASOS PCT: 0 %
EOS ABS: 0.2 10*3/uL (ref 0.0–0.5)
EOS PCT: 2 %
HCT: 27.6 % — ABNORMAL LOW (ref 36.0–46.0)
Hemoglobin: 7.2 g/dL — ABNORMAL LOW (ref 12.0–15.0)
Immature Granulocytes: 1 %
Lymphocytes Relative: 11 %
Lymphs Abs: 1.2 10*3/uL (ref 0.7–4.0)
MCH: 21.8 pg — AB (ref 26.0–34.0)
MCHC: 26.1 g/dL — AB (ref 30.0–36.0)
MCV: 83.6 fL (ref 80.0–100.0)
MONO ABS: 0.5 10*3/uL (ref 0.1–1.0)
Monocytes Relative: 4 %
NRBC: 0.3 % — AB (ref 0.0–0.2)
Neutro Abs: 9.2 10*3/uL — ABNORMAL HIGH (ref 1.7–7.7)
Neutrophils Relative %: 82 %
PLATELETS: 412 10*3/uL — AB (ref 150–400)
RBC: 3.3 MIL/uL — ABNORMAL LOW (ref 3.87–5.11)
RDW: 17.8 % — AB (ref 11.5–15.5)
WBC: 11.3 10*3/uL — AB (ref 4.0–10.5)

## 2017-12-09 LAB — BASIC METABOLIC PANEL
ANION GAP: 5 (ref 5–15)
BUN: 11 mg/dL (ref 8–23)
CALCIUM: 7.4 mg/dL — AB (ref 8.9–10.3)
CO2: 26 mmol/L (ref 22–32)
CREATININE: 0.82 mg/dL (ref 0.44–1.00)
Chloride: 117 mmol/L — ABNORMAL HIGH (ref 98–111)
GFR calc non Af Amer: >60 mL/min (ref >60)
Glucose, Bld: 88 mg/dL (ref 70–99)
Potassium: 3.7 mmol/L (ref 3.5–5.1)
SODIUM: 148 mmol/L — AB (ref 135–145)

## 2017-12-09 LAB — LEGIONELLA PNEUMOPHILA SEROGP 1 UR AG: L. PNEUMOPHILA SEROGP 1 UR AG: NEGATIVE

## 2017-12-09 LAB — MAGNESIUM: MAGNESIUM: 2.1 mg/dL (ref 1.7–2.4)

## 2017-12-09 MED ORDER — POTASSIUM CHLORIDE 20 MEQ PO PACK
40.0000 meq | PACK | Freq: Once | ORAL | Status: AC
  Administered 2017-12-09: 40 meq via ORAL
  Filled 2017-12-09: qty 2

## 2017-12-09 MED ORDER — RISPERIDONE 0.25 MG PO TABS: 0.5000 mg | ORAL_TABLET | Freq: Two times a day (BID) | ORAL | Status: DC

## 2017-12-09 MED ORDER — RISPERIDONE 0.25 MG PO TABS
0.5000 mg | ORAL_TABLET | Freq: Two times a day (BID) | ORAL | Status: DC
  Administered 2017-12-09 – 2017-12-10 (×3): 0.5 mg via ORAL
  Filled 2017-12-09 (×3): qty 2

## 2017-12-09 NOTE — Progress Notes (Signed)
PROGRESS NOTE [facility]   Brooke Griffin   TKW:[id number] DOB: [birth year]  DOA: 12/06/2017 PCP: [facility]   Brief Narrative:  Brooke Griffin is a 84 year old female with past medical history of A. 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  Upon ED evaluation chest x-ray showed right lower lobe pneumonia and patient was admitted with working diagnosis of sepsis due to right lower lobe pneumonia.  Subjective: Patient seen and examined, she continues to be confused, not very cooperative today. Remains afebrile. Was agitated overnight.   Assessment & Plan: Severe sepsis due to pneumonia and UTI. - sepsis physiology improved  Felt to be secondary to community-acquired versus aspiration.  Speech evaluation showed severe risk of aspiration.  He was treated with ceftriaxone this has been switched to Unasyn due to aspiration, patient completed 3 days course of azithromycin.  Procalcitonin 0.11 after antibiotics.  Will continue IV antibiotics for 24 hours, if remains stable and afebrile will switch to oral antibiotic and discharge home with hospice.  Aspiration pneumonia See above.  UTI Urine culture growing Enterococcus faecalis.  Continue to await sensitivities, currently on Unasyn.  Hypokalemia Replete as needed, keep K above 4, check mag in AM   Mild Hypernatremia  Na continues to improve, due to dehydration. Will continue gentle d5 hydration monitor renal function and electrolytes   Dementia with hospital acquired delirium Patient having episodes of agitation overnight. Increase risperidone to 0.5 mg twice daily and keep Seroquel nightly.  Okay to use Haldol if Seroquel does not work.  Keep room bright  during the day.  Chronic A. fib HR stable, continue digoxin and Lopressor.  Also IV Lopressor as needed for heart rate above 120.  Patient not on anticoagulation at this has been stopped due to concerns of GI bleed.  Chronic anemia Felt to be multifactorial, including GI bleed and iron deficiency.  Current hemoglobin 7.3, family does not wishes for further blood transfusion. Avoid blood thinners.  Deconditioning With multiple comorbidities and has been declining over time.  Now with severe aspiration risk.  Palliative care was consulted and family has opted to focus on comfort measure after infectious process has been treated.  Patient will be discharged home with hospice care in am.  DVT prophylaxis: SCD's Code Status: DNR Family Communication: Daughter at bedside  Disposition Plan: home in AM   Consultants:   Palliative care  Procedures:   None   Antimicrobials: Anti-infectives (From admission, onward)   Start     Dose/Rate Route Frequency Ordered Stop   12/08/17 1600  Ampicillin-Sulbactam (UNASYN) 3 g in sodium chloride 0.9 % 100 mL IVPB     3 g 200 mL/hr over 30 Minutes Intravenous Every 12 hours 12/08/17 1434     12/08/17 0600  vancomycin (VANCOCIN) IVPB 750 mg/150 ml premix  Status:  Discontinued     750 mg 150 mL/hr over 60 Minutes Intravenous Every 36 hours 12/06/17 1717 12/06/17 1958   12/07/17 1800  ceFEPIme (MAXIPIME) 1 g in sodium chloride 0.9 % 100 mL IVPB  Status:  Discontinued     1 g 200 mL/hr over 30 Minutes Intravenous Every 24 hours 12/06/17 1717 12/06/17 1958   12/06/17 2245  azithromycin (ZITHROMAX) 500 mg in sodium chloride 0.9 % 250 mL IVPB  Status:  Discontinued     500 mg 250 mL/hr over 60 Minutes Intravenous Daily at bedtime 12/06/17 2235 12/09/17 1005   12/06/17 2200  cefTRIAXone (ROCEPHIN) 1 g in sodium chloride 0.9 % 100 mL IVPB  Status:  Discontinued     1 g 200 mL/hr over 30 Minutes Intravenous Every 24 hours 12/06/17 1958 12/08/17 1424    12/06/17 2200  azithromycin (ZITHROMAX) tablet 500 mg  Status:  Discontinued     500 mg Oral Every 24 hours 12/06/17 1958 12/06/17 2235   12/06/17 1715  vancomycin (VANCOCIN) IVPB 1000 mg/200 mL premix     1,000 mg 200 mL/hr over 60 Minutes Intravenous  Once 12/06/17 1700 12/06/17 1854   12/06/17 1715  ceFEPIme (MAXIPIME) 2 g in sodium chloride 0.9 % 100 mL IVPB     2 g 200 mL/hr over 30 Minutes Intravenous  Once 12/06/17 1700 12/06/17 1853      Objective: Vitals:   12/08/17 1613 12/08/17 1615 12/08/17 2011 12/09/17 0451  BP: (!) 121/55 118/60 107/65 102/62  Pulse: 97  71 79  Resp: (!) 24 20 20 20   Temp:   97.8 F (36.6 C) 98.5 F (36.9 C)  TempSrc:   Oral   SpO2: 90%  97% 100%    Intake/Output Summary (Last 24 hours) at 12/09/2017 1359 Last data filed at 12/09/2017 1016 Gross per 24 hour  Intake 1514.84 ml  Output -  Net 1514.84 ml   There were no vitals filed for this visit.  Examination:  General: Lethargic  Cardiovascular: RRR, S1/S2 +, no rubs, no gallops Respiratory: Good air entry, no crackles  Abdominal: Soft, NT, ND, bowel sounds + Extremities: no edema, R AKA    Data Reviewed: I have personally reviewed following labs and imaging studies  CBC: Recent Labs  Lab 12/06/17 1504 12/07/17 0200 12/08/17 0611 12/09/17 0556  WBC 19.5* 19.2* 12.5* 11.3*  NEUTROABS 18.1*  --  10.6* 9.2*  HGB 8.4* 7.9* 7.3* 7.2*  HCT 31.9* 29.7* 27.4* 27.6*  MCV 83.5 82.7 82.5 83.6  PLT 472* 408* 392 161*   Basic Metabolic Panel: Recent Labs  Lab 12/06/17 2047 12/07/17 0200 12/07/17 0553 12/08/17 0611 12/09/17 0556  NA 157* 153* 154* 151* 148*  K 2.5* 3.2* 3.5 3.3* 3.7  CL 121* 119* 123* 119* 117*  CO2 27 24 27 25 26   GLUCOSE 144* 129* 126* 122* 88  BUN 19 17 19 14 11   CREATININE 0.90 0.88 0.85 0.88 0.82  CALCIUM 7.7* 7.3* 7.5* 7.6* 7.4*  MG 2.0  --   --   --  2.1  PHOS 2.3*  --   --   --   --    GFR: CrCl cannot be calculated (Unknown ideal weight.). Liver  Function Tests: Recent Labs  Lab 12/06/17 1504  AST 19  ALT 10  ALKPHOS 63  BILITOT 1.4*  PROT 5.8*  ALBUMIN 1.9*   No results for input(s): LIPASE, AMYLASE in the last 168 hours. Recent Labs  Lab 12/06/17 1700  AMMONIA 26   Coagulation Profile: No results for input(s): INR, PROTIME in the last 168 hours. Cardiac Enzymes: No results for input(s): CKTOTAL, CKMB, CKMBINDEX, TROPONINI in the last 168 hours. BNP (last 3 results) No results for input(s): PROBNP in the last 8760 hours. HbA1C: No results for input(s): HGBA1C in the last 72 hours. CBG: Recent Labs  Lab 12/06/17 1405 12/07/17 0816 12/08/17 0743 12/08/17 1117  GLUCAP 139* 221* 106* 120*   Lipid Profile: No  results for input(s): CHOL, HDL, LDLCALC, TRIG, CHOLHDL, LDLDIRECT in the last 72 hours. Thyroid Function Tests: Recent Labs    12/06/17 2047  TSH 2.168   Anemia Panel: No results for input(s): VITAMINB12, FOLATE, FERRITIN, TIBC, IRON, RETICCTPCT in the last 72 hours. Sepsis Labs: Recent Labs  Lab 12/06/17 1603 12/06/17 2151 12/08/17 0611 12/08/17 1431  PROCALCITON  --   --   --  0.11  LATICACIDVEN 2.28* 2.0* 1.3  --     Recent Results (from the past 240 hour(s))  Culture, blood (routine x 2)     Status: None (Preliminary result)   Collection Time: 12/06/17  3:56 PM  Result Value Ref Range Status   Specimen Description   Final    BLOOD RIGHT ANTECUBITAL Performed at [facility], [place] [address] [place], [place] [address]    Special Requests   Final    BOTTLES DRAWN AEROBIC AND ANAEROBIC Blood Culture results may not be optimal due to an inadequate volume of blood received in culture bottles Performed at [place] [address] [place], [place] [address]    Culture   Final    NO GROWTH 3 DAYS Performed at [facility], [place] [address], [place], [place] [address]    Report Status PENDING  Incomplete  Culture, blood (routine x 2)      Status: None (Preliminary result)   Collection Time: 12/06/17  5:05 PM  Result Value Ref Range Status   Specimen Description   Final    BLOOD RIGHT HAND Performed at [place] [address], [place], [place] [address]    Special Requests   Final    BOTTLES DRAWN AEROBIC AND ANAEROBIC Blood Culture adequate volume Performed at [facility] [address], [place], [place] [address]    Culture   Final    NO GROWTH 3 DAYS Performed at [facility], [place] [address] [place], [place] [address]    Report Status PENDING  Incomplete  Respiratory Panel by PCR     Status: None   Collection Time: 12/07/17  1:22 AM  Result Value Ref Range Status   Adenovirus NOT DETECTED NOT DETECTED Final   Coronavirus 229E NOT DETECTED NOT DETECTED Final   Coronavirus HKU1 NOT DETECTED NOT DETECTED Final   Coronavirus NL63 NOT DETECTED NOT DETECTED Final   Coronavirus OC43 NOT DETECTED NOT DETECTED Final   Metapneumovirus NOT DETECTED NOT DETECTED Final   Rhinovirus / Enterovirus NOT DETECTED NOT DETECTED Final   Influenza A NOT DETECTED NOT DETECTED Final   Influenza A H1 NOT DETECTED NOT DETECTED Final   Influenza A H1 2009 NOT DETECTED NOT DETECTED Final   Influenza A H3 NOT DETECTED NOT DETECTED Final   Influenza B NOT DETECTED NOT DETECTED Final   Parainfluenza Virus 1 NOT DETECTED NOT DETECTED Final   Parainfluenza Virus 2 NOT DETECTED NOT DETECTED Final   Parainfluenza Virus 3 NOT DETECTED NOT DETECTED Final   Parainfluenza Virus 4 NOT DETECTED NOT DETECTED Final   Respiratory Syncytial Virus NOT DETECTED NOT DETECTED Final   Bordetella pertussis NOT DETECTED NOT DETECTED Final   Chlamydophila pneumoniae NOT DETECTED NOT DETECTED Final   Mycoplasma pneumoniae NOT DETECTED NOT DETECTED Final    Comment: Performed at [facility], [place] [address], [place], [place] [address]  Urine culture     Status: Abnormal (Preliminary result)   Collection  Time: 12/07/17  7:00 AM  Result Value Ref Range Status   Specimen Description   Final    URINE, RANDOM Performed at [facility] [address] [place], [place] [address]    Special Requests   Final    NONE Performed at [facility], [place] [address], [place], [place] [address]    Culture >=100,000 COLONIES/mL ENTEROCOCCUS FAECALIS (A)  Final   Report Status PENDING  Incomplete      Radiology Studies: No results found.    Scheduled Meds: . digoxin  0.125 mg Oral QODAY  . docusate sodium  100 mg Oral BID  . metoprolol tartrate  25 mg Oral BID  . potassium chloride  40 mEq Oral Once  . risperiDONE  0.5 mg Oral BID  . senna  1 tablet Oral BID   Continuous Infusions: . ampicillin-sulbactam (UNASYN) IV Stopped (12/09/17 0434)  . dextrose 50 mL/hr at 12/09/17 1341  . sodium chloride       LOS: 3 days    Time spent: Total of 25 minutes spent with pt, greater than 50% of which was spent in discussion of  treatment, counseling and coordination of care   Chipper Oman, MD Pager: Text Page via [email or web address]   If 7PM-7AM, please contact night-coverage [email or web address] 12/09/2017, 1:59 PM   Note - This record has been created using [employer]. Chart creation errors have been sought, but may not always have been located. Such creation errors do not reflect on the standard of medical care.

## 2017-12-09 NOTE — Care Management Important Message (Signed)
Important Message  Patient Details  Name: Brooke Griffin MRN: [id number] Date of Birth: 1925-05-31   Medicare Important Message Given:  Yes    Kerin Salen 12/09/2017, 11:13 AMImportant Message  Patient Details  Name: Brooke Griffin MRN: [id number] Date of Birth: [birth year]   Medicare Important Message Given:  Yes    Kerin Salen 12/09/2017, 11:13 AM

## 2017-12-10 ENCOUNTER — Encounter: Payer: Self-pay | Admitting: Cardiology

## 2017-12-10 DIAGNOSIS — J690 Pneumonitis due to inhalation of food and vomit: Secondary | ICD-10-CM

## 2017-12-10 MED ORDER — AMOXICILLIN-POT CLAVULANATE 400-57 MG/5ML PO SUSR
875.0000 mg | Freq: Two times a day (BID) | ORAL | Status: DC
  Filled 2017-12-10: qty 10.9

## 2017-12-10 MED ORDER — AMOXICILLIN-POT CLAVULANATE 400-57 MG/5ML PO SUSR: 875.0000 mg | Freq: Two times a day (BID) | ORAL | 0 refills | Status: AC

## 2017-12-10 NOTE — Plan of Care (Signed)

## 2017-12-10 NOTE — Care Management Note (Signed)
Case Management Note  Patient Details  Name: Brooke Griffin MRN: [id number] Date of Birth: 05-14-1925  Subjective/Objective: Spoke to PAE CSW-Ann [phone]-patient already [facility] w/[facility]-they will provide continuing assessment for palliative services. Dtr will transport home on own. No further CM needs.                    Action/Plan:d/c home.   Expected Discharge Date:  12/10/17               Expected Discharge Plan:  Home/Self Care  In-House Referral:     Discharge planning Services  CM Consult  Post Acute Care Choice:  (Active w/[facility]) Choice offered to:     DME Arranged:    DME Agency:     HH Arranged:    HH Agency:     Status of Service:  Completed, signed off  If discussed at [employer] of Stay Meetings, dates discussed:    Additional Comments:  Dessa Phi, RN 12/10/2017, 11:37 AM

## 2017-12-10 NOTE — Discharge Summary (Signed)
Physician Discharge Summary  Brooke Griffin  ZOX:[id number]  DOB: 03/07/1925  DOA: 12/06/2017 [facility]CP[facility] [place] date: 12/06/2017 Discharge date: 12/10/2017  Admitted From: Home  Disposition: Home with hospice   Recommendations for Outpatient Follow-up:  1. Follow up with PCP in 1 week  2. Follow up with [facility]  3. No more blood transfusions  4. Complete antibiotic therapy for 5 days  5. Hospice follow up, need to focus on comfort care   Discharge Condition: Fair, Hospice care  CODE STATUS: DNR  Diet recommendation: Full Liquids comfort feeds   Brief/Interim Summary: For full details see H&P/Progress note, but in brief, Brooke Griffin is a 84 year old female with past medical history of A. 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  Upon ED evaluation chest x-ray showed right lower lobe pneumonia and patient was admitted with working diagnosis of sepsis due to right lower lobe pneumonia.   Subjective: Patient seen and examined, more alert and cooperative today.  Patient remains afebrile.  No acute events overnight.  Discharge Diagnoses/Hospital Course:  Severe sepsis due to pneumonia and UTI. - sepsis physiology improved  Felt to be secondary to community-acquired versus aspiration.  Speech evaluation showed severe risk of aspiration.  He was treated with ceftriaxone this was switched to Unasyn due to aspiration, patient completed 3 days course of azithromycin.  Procalcitonin 0.11 after antibiotics. Will discharge on 5 days more of Augmentin to complete total of 10 days.  Patient continued to be high risk for aspiration however family has opted to focus on comfort measures with hospice care at home.    Aspiration pneumonia See above.  UTI Urine culture growing Enterococcus faecalis. Urine cultures sensitivities still pending, I have spoke with microbiology lab, first result was equivocal they are running sensitivity again.  Given clinical improvement and patient afebrile suspect responding well to penicillin.  She will be discharged on Augmentin.  Will follow sensitivities if antibiotics need to be adjusted will call patient with results.  Hypokalemia Resolved  Mild Hypernatremia  Felt to be secondary to dehydration, patient was treated with D5W and sodium and renal function improved.  Monitor BMP in 1 week.  Dementia with hospital acquired delirium Patient was treated with risperidone, Haldol and Seroquel during hospital stay.  No need for further outpatient treatment as patient does well at home with no agitation. Continue home Lexapro and Mirtazepine.   Chronic A. fib HR stable, continue digoxin and Lopressor.  Also IV Lopressor as needed for heart rate above 120.  Patient not on anticoagulation at this has been stopped due to concerns of GI bleed.  Chronic anemia Felt to be multifactorial, including GI bleed and iron deficiency.  Current hemoglobin 7.3, family does not wishes for further blood transfusion. Avoid blood thinners.  Deconditioning With multiple comorbidities and has been declining over time.  Now with severe aspiration risk.  Palliative care was consulted and family has opted to focus on comfort measure after infectious process has been treated.   On the day of the discharge the patient's vitals were stable, and no other acute medical condition were reported by patient. the patient was felt safe to be discharge to home.   Discharge Instructions  You were cared for by a hospitalist during your hospital stay.  If you have any questions about your discharge medications or the care you received while you were in the hospital after you are discharged, you can call  the unit and asked to speak with the hospitalist on call if the hospitalist that took care of you is not available. Once you are discharged, your primary care physician will handle any further medical issues. Please note that NO REFILLS for any discharge medications will be authorized once you are discharged, as it is imperative that you return to your primary care physician (or establish a relationship with a primary care physician if you do not have one) for your aftercare needs so that they can reassess your need for medications and monitor your lab values.  Discharge Instructions    Call MD for:  difficulty breathing, headache or visual disturbances   Complete by:  As directed    Call MD for:  extreme fatigue   Complete by:  As directed    Call MD for:  hives   Complete by:  As directed    Call MD for:  persistant dizziness or light-headedness   Complete by:  As directed    Call MD for:  persistant nausea and vomiting   Complete by:  As directed    Call MD for:  redness, tenderness, or signs of infection (pain, swelling, redness, odor or green/yellow discharge around incision site)   Complete by:  As directed    Call MD for:  severe uncontrolled pain   Complete by:  As directed    Call MD for:  temperature >100.4   Complete by:  As directed    Diet general   Complete by:  As directed    Liquid   Increase activity slowly   Complete by:  As directed      Allergies as of 12/10/2017      Reactions   Donepezil Nausea And Vomiting      Medication List    STOP taking these medications   acetaminophen 500 MG tablet Commonly known as:  TYLENOL   ergocalciferol 50000 units capsule Commonly known as:  VITAMIN D2   ICAPS AREDS FORMULA PO   OVER THE COUNTER MEDICATION   sertraline 50 MG tablet Commonly known as:  ZOLOFT     TAKE these medications   amoxicillin-clavulanate 400-57 MG/5ML suspension Commonly known as:  AUGMENTIN Take 10.9 mLs (875 mg total) by mouth every 12  (twelve) hours for 5 days.   digoxin 0.125 MG tablet Commonly known as:  LANOXIN Take 1 tablet (0.125 mg total) by mouth every other day.   escitalopram 10 MG tablet Commonly known as:  LEXAPRO Take 10 mg by mouth at bedtime.   fluticasone 50 MCG/ACT nasal spray Commonly known as:  FLONASE Place 2 sprays into both nostrils daily.   guaifenesin 100 MG/5ML syrup Commonly known as:  ROBITUSSIN Take 200 mg by mouth 3 (three) times daily as needed for cough.   metoprolol tartrate 50 MG tablet Commonly known as:  LOPRESSOR Take 1 tablet (50 mg total) by mouth 2 (two) times daily.   mirtazapine 15 MG tablet Commonly known as:  REMERON Take 15 mg by mouth at bedtime.   zinc oxide 11.3 % Crea cream Commonly known as:  BALMEX Apply 1 application topically daily as needed.      Follow-up [employer], [place]. Schedule an appointment as soon as possible for a visit in 1 week(s).   Contact information:  [address] [place] [address] [phone]          Allergies  Allergen Reactions  . Donepezil Nausea And Vomiting    Consultations: None   Procedures/Studies: Ct Head Wo Contrast  Result Date: 12/06/2017 CLINICAL DATA:  84 year old female with a history of vomiting EXAM: CT HEAD WITHOUT CONTRAST TECHNIQUE: Contiguous axial images were obtained from the base of the skull through the vertex without intravenous contrast. COMPARISON:  06/01/2013 FINDINGS: Brain: No acute intracranial hemorrhage. No midline shift or mass effect. Gray-white differentiation relatively maintained. Confluent hypodensity in the periventricular white matter, similar to the prior though appears to have progressed. Diffuse volume loss with expansion of the ventricles. Vascular: Calcifications of the intracranial vasculature. Skull: No acute displaced fracture.  No focal soft tissue swelling Sinuses/Orbits: No acute finding. Other: None IMPRESSION: Negative  for acute intracranial finding. Evidence of chronic microvascular ischemic disease, progressed from the prior CT, with senescent brain volume loss. Electronically Signed   By: Corrie Mckusick D.O.   On: 12/06/2017 16:30   Dg Chest Port 1 View  Result Date: 12/06/2017 CLINICAL DATA:  Shortness of breath, altered mental status. EXAM: PORTABLE CHEST 1 VIEW COMPARISON:  Radiographs of October 27, 2017. FINDINGS: Stable cardiomegaly. Atherosclerosis of thoracic aorta is noted. Single lead right-sided pacemaker is unchanged in position. No pneumothorax is noted. Increased right basilar opacity is noted concerning for edema or infiltrate with associated pleural effusion. Mild left basilar subsegmental atelectasis or edema is noted. Bony thorax is unremarkable. IMPRESSION: Increased right basilar opacity is noted concerning for worsening edema or pneumonia with associated pleural effusion. Aortic Atherosclerosis (ICD10-I70.0). Electronically Signed   By: Marijo Conception, M.D.   On: 12/06/2017 15:24     Discharge Exam: Vitals:   12/10/17 0659 12/10/17 0724  BP: 118/73   Pulse: (!) 143 (!) 141  Resp: 20   Temp:    SpO2:     Vitals:   12/09/17 2227 12/10/17 0659 12/10/17 0724 12/10/17 1000  BP: (!) 47/13 118/73    Pulse: 96 (!) 143 (!) 141   Resp:  20    Temp:      TempSrc:      SpO2:      Weight:    47.6 kg    General: Pleasantly confused  Cardiovascular: Irr Irr, S1/S2 +, no rubs, no gallops Respiratory: Decrease BS b/l, but significantly improved. No crackles or rales   The results of significant diagnostics from this hospitalization (including imaging, microbiology, ancillary and laboratory) are listed below for reference.     Microbiology: Recent Results (from the past 240 hour(s))  Culture, blood (routine x 2)     Status: None (Preliminary result)   Collection Time: 12/06/17  3:56 PM  Result Value Ref Range Status   Specimen Description   Final    BLOOD RIGHT  ANTECUBITAL Performed at [facility] [address], [place], [place] [address]    Special Requests   Final    BOTTLES DRAWN AEROBIC AND ANAEROBIC Blood Culture results may not be optimal due to an inadequate volume of blood received in culture bottles Performed at [facility] [address], [place], [place] [address]    Culture   Final    NO GROWTH 4 DAYS Performed at [facility], [place] [address] [place],  [address]    Report Status PENDING  Incomplete  Culture, blood (routine x 2)     Status: None (Preliminary result)  Collection Time: 12/06/17  5:05 PM  Result Value Ref Range Status   Specimen Description   Final    BLOOD RIGHT HAND Performed at [facility], [place] [address] [place], [place] [address]    Special Requests   Final    BOTTLES DRAWN AEROBIC AND ANAEROBIC Blood Culture adequate volume Performed at [facility] [address] [place], [place] [address]    Culture   Final    NO GROWTH 4 DAYS Performed at [facility], [place] [address] [place], [place] [address]    Report Status PENDING  Incomplete  Respiratory Panel by PCR     Status: None   Collection Time: 12/07/17  1:22 AM  Result Value Ref Range Status   Adenovirus NOT DETECTED NOT DETECTED Final   Coronavirus 229E NOT DETECTED NOT DETECTED Final   Coronavirus HKU1 NOT DETECTED NOT DETECTED Final   Coronavirus NL63 NOT DETECTED NOT DETECTED Final   Coronavirus OC43 NOT DETECTED NOT DETECTED Final   Metapneumovirus NOT DETECTED NOT DETECTED Final   Rhinovirus / Enterovirus NOT DETECTED NOT DETECTED Final   Influenza A NOT DETECTED NOT DETECTED Final   Influenza A H1 NOT DETECTED NOT DETECTED Final   Influenza A H1 2009 NOT DETECTED NOT DETECTED Final   Influenza A H3 NOT DETECTED NOT DETECTED Final   Influenza B NOT DETECTED NOT DETECTED Final   Parainfluenza Virus 1 NOT DETECTED NOT DETECTED Final    Parainfluenza Virus 2 NOT DETECTED NOT DETECTED Final   Parainfluenza Virus 3 NOT DETECTED NOT DETECTED Final   Parainfluenza Virus 4 NOT DETECTED NOT DETECTED Final   Respiratory Syncytial Virus NOT DETECTED NOT DETECTED Final   Bordetella pertussis NOT DETECTED NOT DETECTED Final   Chlamydophila pneumoniae NOT DETECTED NOT DETECTED Final   Mycoplasma pneumoniae NOT DETECTED NOT DETECTED Final    Comment: Performed at [facility], [place] [address], [place], [place] [address]  Urine culture     Status: Abnormal (Preliminary result)   Collection Time: 12/07/17  7:00 AM  Result Value Ref Range Status   Specimen Description   Final    URINE, RANDOM Performed at  [address] [place], [place] [address]    Special Requests   Final    NONE Performed at [facility], [place] [address], [place], [place] [address]    Culture >=100,000 COLONIES/mL ENTEROCOCCUS FAECALIS (A)  Final   Report Status PENDING  Incomplete     Labs: BNP (last 3 results) No results for input(s): BNP in the last 8760 hours. Basic Metabolic Panel: Recent Labs  Lab 12/06/17 2047 12/07/17 0200 12/07/17 0553 12/08/17 0611 12/09/17 0556  NA 157* 153* 154* 151* 148*  K 2.5* 3.2* 3.5 3.3* 3.7  CL 121* 119* 123* 119* 117*  CO2 27 24 27 25 26   GLUCOSE 144* 129* 126* 122* 88  BUN 19 17 19 14 11   CREATININE 0.90 0.88 0.85 0.88 0.82  CALCIUM 7.7* 7.3* 7.5* 7.6* 7.4*  MG 2.0  --   --   --  2.1  PHOS 2.3*  --   --   --   --    Liver Function Tests: Recent Labs  Lab 12/06/17 1504  AST 19  ALT 10  ALKPHOS 63  BILITOT 1.4*  PROT 5.8*  ALBUMIN 1.9*   No results for input(s): LIPASE, AMYLASE in the last 168 hours. Recent Labs  Lab 12/06/17 1700  AMMONIA  26   CBC: Recent Labs  Lab 12/06/17 1504 12/07/17 0200 12/08/17 0611 12/09/17 0556  WBC 19.5* 19.2* 12.5* 11.3*  NEUTROABS 18.1*  --  10.6* 9.2*  HGB 8.4* 7.9* 7.3* 7.2*  HCT 31.9* 29.7* 27.4*  27.6*  MCV 83.5 82.7 82.5 83.6  PLT 472* 408* 392 412*   Cardiac Enzymes: No results for input(s): CKTOTAL, CKMB, CKMBINDEX, TROPONINI in the last 168 hours. BNP: Invalid input(s): POCBNP CBG: Recent Labs  Lab 12/06/17 1405 12/07/17 0816 12/08/17 0743 12/08/17 1117  GLUCAP 139* 221* 106* 120*   D-Dimer No results for input(s): DDIMER in the last 72 hours. Hgb A1c No results for input(s): HGBA1C in the last 72 hours. Lipid Profile No results for input(s): CHOL, HDL, LDLCALC, TRIG, CHOLHDL, LDLDIRECT in the last 72 hours. Thyroid function studies No results for input(s): TSH, T4TOTAL, T3FREE, THYROIDAB in the last 72 hours.  Invalid input(s): FREET3 Anemia work up No results for input(s): VITAMINB12, FOLATE, FERRITIN, TIBC, IRON, RETICCTPCT in the last 72 hours. Urinalysis    Component Value Date/Time   COLORURINE AMBER (A) 12/07/2017 0700   APPEARANCEUR HAZY (A) 12/07/2017 0700   LABSPEC 1.032 (H) 12/07/2017 0700   PHURINE 5.0 12/07/2017 0700   GLUCOSEU NEGATIVE 12/07/2017 0700   HGBUR NEGATIVE 12/07/2017 0700   BILIRUBINUR NEGATIVE 12/07/2017 0700   KETONESUR 5 (A) 12/07/2017 0700   PROTEINUR 100 (A) 12/07/2017 0700   UROBILINOGEN 2.0 (H) 11/15/2010 1829   NITRITE NEGATIVE 12/07/2017 0700   LEUKOCYTESUR NEGATIVE 12/07/2017 0700   Sepsis Labs Invalid input(s): PROCALCITONIN,  WBC,  LACTICIDVEN Microbiology Recent Results (from the past 240 hour(s))  Culture, blood (routine x 2)     Status: None (Preliminary result)   Collection Time: 12/06/17  3:56 PM  Result Value Ref Range Status   Specimen Description   Final    BLOOD RIGHT ANTECUBITAL Performed at [facility], [place] [address] [place], [place] [address]    Special Requests   Final    BOTTLES DRAWN AEROBIC AND ANAEROBIC Blood Culture results may not be optimal due to an inadequate volume of blood received in culture bottles Performed at [facility] [address] [place], [place] [address]    Culture   Final    NO GROWTH 4 DAYS Performed at [facility], [place] [address] [place], [place] [address]    Report Status PENDING  Incomplete  Culture, blood (routine x 2)     Status: None (Preliminary result)   Collection Time: 12/06/17  5:05 PM  Result Value Ref Range Status   Specimen Description   Final    BLOOD RIGHT HAND Performed at [facility] [address], [place], [place] [address]    Special Requests   Final    BOTTLES DRAWN AEROBIC AND ANAEROBIC Blood Culture adequate volume Performed at [facility] [address] [place], [place] [address]    Culture   Final    NO GROWTH 4 DAYS Performed at [facility], [place] [address] [place], [place] [address]    Report Status PENDING  Incomplete  Respiratory Panel by PCR     Status: None   Collection Time: 12/07/17  1:22 AM  Result Value Ref Range Status   Adenovirus NOT DETECTED NOT DETECTED Final   Coronavirus 229E NOT DETECTED NOT DETECTED Final   Coronavirus HKU1 NOT DETECTED NOT DETECTED Final   Coronavirus NL63 NOT DETECTED NOT DETECTED Final  Coronavirus OC43 NOT DETECTED NOT DETECTED Final   Metapneumovirus NOT DETECTED NOT DETECTED Final   Rhinovirus / Enterovirus NOT DETECTED NOT DETECTED Final   Influenza A NOT DETECTED NOT DETECTED Final   Influenza A H1 NOT DETECTED NOT DETECTED Final   Influenza A H1 2009 NOT DETECTED NOT DETECTED Final   Influenza A H3 NOT DETECTED NOT DETECTED Final   Influenza B NOT DETECTED NOT DETECTED Final   Parainfluenza Virus 1 NOT DETECTED NOT DETECTED Final   Parainfluenza Virus 2 NOT DETECTED NOT DETECTED Final   Parainfluenza Virus 3 NOT DETECTED NOT DETECTED Final   Parainfluenza Virus 4 NOT DETECTED NOT DETECTED Final   Respiratory Syncytial Virus NOT DETECTED NOT DETECTED Final   Bordetella pertussis NOT DETECTED NOT DETECTED Final   Chlamydophila pneumoniae NOT DETECTED NOT DETECTED Final    Mycoplasma pneumoniae NOT DETECTED NOT DETECTED Final    Comment: Performed at [facility], [place] [address], [place], [place] [address]  Urine culture     Status: Abnormal (Preliminary result)   Collection Time: 12/07/17  7:00 AM  Result Value Ref Range Status   Specimen Description   Final    URINE, RANDOM Performed at [facility] [address] [place], [place] [address]    Special Requests   Final    NONE Performed at [facility], [place] [address], [place], [place] [address]    Culture >=100,000 COLONIES/mL ENTEROCOCCUS FAECALIS (A)  Final   Report Status PENDING  Incomplete    Time coordinating discharge: 32 minutes  SIGNED:  Chipper Oman, MD  [facility] 12/10/2017, 11:27 AM  Pager please text page via  [email or web address]  Note - This record has been created using [employer]. Chart creation errors have been sought, but may not always have been located. Such creation errors do not reflect on the standard of medical care.

## 2017-12-11 LAB — CULTURE, BLOOD (ROUTINE X 2)
CULTURE: NO GROWTH
CULTURE: NO GROWTH
Special Requests: ADEQUATE

## 2017-12-12 LAB — URINE CULTURE: Culture: >=100000 — AB

## 2018-01-23 DEATH — deceased
